# Patient Record
Sex: Male | Born: 1935 | Race: White | Hispanic: No | Marital: Married
Health system: Southern US, Community
[De-identification: ages and names within clinical notes are randomized; demographics above are authoritative.]

## PROBLEM LIST (undated history)

## (undated) ENCOUNTER — Encounter: Attending: Hematology & Oncology | Primary: Hematology & Oncology

## (undated) ENCOUNTER — Telehealth: Attending: Hematology & Oncology | Primary: Hematology & Oncology

## (undated) ENCOUNTER — Ambulatory Visit

## (undated) ENCOUNTER — Encounter

## (undated) ENCOUNTER — Telehealth: Attending: Internal Medicine | Primary: Internal Medicine

## (undated) ENCOUNTER — Telehealth
Attending: Pharmacist Clinician (PhC)/ Clinical Pharmacy Specialist | Primary: Pharmacist Clinician (PhC)/ Clinical Pharmacy Specialist

## (undated) ENCOUNTER — Non-Acute Institutional Stay: Payer: MEDICARE | Attending: Family | Primary: Family

## (undated) ENCOUNTER — Encounter: Payer: MEDICARE | Attending: Gerontology | Primary: Gerontology

## (undated) ENCOUNTER — Encounter: Attending: Family | Primary: Family

## (undated) ENCOUNTER — Telehealth

## (undated) ENCOUNTER — Telehealth: Attending: Children | Primary: Children

## (undated) ENCOUNTER — Encounter: Payer: MEDICARE | Attending: Family | Primary: Family

## (undated) ENCOUNTER — Encounter: Attending: Internal Medicine | Primary: Internal Medicine

## (undated) ENCOUNTER — Ambulatory Visit: Payer: MEDICARE | Attending: Internal Medicine | Primary: Internal Medicine

## (undated) ENCOUNTER — Telehealth: Attending: Family | Primary: Family

## (undated) ENCOUNTER — Ambulatory Visit: Payer: MEDICARE | Attending: Family | Primary: Family

## (undated) ENCOUNTER — Encounter: Attending: Children | Primary: Children

## (undated) ENCOUNTER — Ambulatory Visit: Payer: MEDICARE

## (undated) ENCOUNTER — Encounter: Payer: MEDICARE | Attending: Foot & Ankle Surgery | Primary: Foot & Ankle Surgery

## (undated) ENCOUNTER — Ambulatory Visit: Payer: MEDICARE | Attending: Hematology & Oncology | Primary: Hematology & Oncology

## (undated) ENCOUNTER — Ambulatory Visit
Attending: Pharmacist Clinician (PhC)/ Clinical Pharmacy Specialist | Primary: Pharmacist Clinician (PhC)/ Clinical Pharmacy Specialist

## (undated) ENCOUNTER — Encounter: Payer: MEDICARE | Attending: Internal Medicine | Primary: Internal Medicine

## (undated) MED ORDER — CHLORTHALIDONE 25 MG TABLET: tablet | 0 refills | 0 days

## (undated) MED ORDER — POLYETHYLENE GLYCOL 3350 17 GRAM/DOSE ORAL POWDER: Freq: Every day | ORAL | 0 days

## (undated) MED ORDER — VITAMIN B COMPLEX CAPSULE: Freq: Every day | ORAL | 0 days

## (undated) MED ORDER — CHLORTHALIDONE 25 MG TABLET: Freq: Every morning | ORAL | 0 days | Status: SS

---
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
## 2004-11-23 ENCOUNTER — Ambulatory Visit: Payer: Self-pay | Admitting: Gastroenterology

## 2009-06-07 ENCOUNTER — Ambulatory Visit: Payer: Self-pay | Admitting: Internal Medicine

## 2010-08-25 ENCOUNTER — Ambulatory Visit: Payer: Self-pay | Admitting: Urology

## 2011-01-01 ENCOUNTER — Ambulatory Visit: Payer: Self-pay | Admitting: Urology

## 2011-01-17 ENCOUNTER — Inpatient Hospital Stay: Payer: Self-pay | Admitting: Internal Medicine

## 2011-01-17 DIAGNOSIS — I459 Conduction disorder, unspecified: Secondary | ICD-10-CM

## 2011-01-17 LAB — BASIC METABOLIC PANEL
Anion Gap: 12 (ref 7–16)
BUN: 15 mg/dL (ref 7–18)
Calcium, Total: 8.7 mg/dL (ref 8.5–10.1)
Chloride: 107 mmol/L (ref 98–107)
Co2: 26 mmol/L (ref 21–32)
Creatinine: 0.71 mg/dL (ref 0.60–1.30)
EGFR (African American): >60
EGFR (Non-African Amer.): >60
Glucose: 93 mg/dL (ref 65–99)
Osmolality: 289 (ref 275–301)
Potassium: 4 mmol/L (ref 3.5–5.1)
Sodium: 145 mmol/L (ref 136–145)

## 2011-01-17 LAB — CBC WITH DIFFERENTIAL/PLATELET
Basophil #: 0 10*3/uL (ref 0.0–0.1)
Basophil %: 0.4 %
Eosinophil #: 0.1 10*3/uL (ref 0.0–0.7)
Eosinophil %: 1.3 %
HCT: 37.7 % — ABNORMAL LOW (ref 40.0–52.0)
HGB: 12.5 g/dL — ABNORMAL LOW (ref 13.0–18.0)
Lymphocyte #: 0.9 10*3/uL — ABNORMAL LOW (ref 1.0–3.6)
Lymphocyte %: 18.3 %
MCH: 31 pg (ref 26.0–34.0)
MCHC: 33.2 g/dL (ref 32.0–36.0)
MCV: 94 fL (ref 80–100)
Monocyte #: 0.4 10*3/uL (ref 0.0–0.7)
Monocyte %: 7.7 %
Neutrophil #: 3.4 10*3/uL (ref 1.4–6.5)
Neutrophil %: 72.3 %
Platelet: 275 10*3/uL (ref 150–440)
RBC: 4.03 10*6/uL — ABNORMAL LOW (ref 4.40–5.90)
RDW: 13.1 % (ref 11.5–14.5)
WBC: 4.7 10*3/uL (ref 3.8–10.6)

## 2011-01-17 LAB — CK TOTAL AND CKMB (NOT AT ARMC)
CK, Total: 72 U/L (ref 35–232)
CK, Total: 78 U/L (ref 35–232)
CK-MB: 1.9 ng/mL (ref 0.5–3.6)
CK-MB: 2 ng/mL (ref 0.5–3.6)

## 2011-01-17 LAB — TROPONIN I
Troponin-I: <0.02 ng/mL
Troponin-I: 0.08 ng/mL — ABNORMAL HIGH

## 2011-01-17 LAB — PLATELET COUNT: Platelet: 291 10*3/uL (ref 150–440)

## 2011-01-17 LAB — PROTIME-INR: Prothrombin Time: 13.2 secs (ref 11.5–14.7)

## 2011-01-18 LAB — CBC WITH DIFFERENTIAL/PLATELET
Basophil %: 0.6 %
Eosinophil %: 1.8 %
HCT: 36.2 % — ABNORMAL LOW (ref 40.0–52.0)
HGB: 12 g/dL — ABNORMAL LOW (ref 13.0–18.0)
Lymphocyte #: 0.8 10*3/uL — ABNORMAL LOW (ref 1.0–3.6)
MCH: 31.2 pg (ref 26.0–34.0)
MCV: 94 fL (ref 80–100)
Monocyte #: 0.3 10*3/uL (ref 0.0–0.7)
Monocyte %: 7.4 %
Neutrophil #: 3.4 10*3/uL (ref 1.4–6.5)
RBC: 3.86 10*6/uL — ABNORMAL LOW (ref 4.40–5.90)
WBC: 4.6 10*3/uL (ref 3.8–10.6)

## 2011-01-18 LAB — BASIC METABOLIC PANEL
Anion Gap: 10 (ref 7–16)
Calcium, Total: 8.4 mg/dL — ABNORMAL LOW (ref 8.5–10.1)
Co2: 28 mmol/L (ref 21–32)
Creatinine: 0.8 mg/dL (ref 0.60–1.30)
EGFR (African American): >60
EGFR (Non-African Amer.): >60
Glucose: 98 mg/dL (ref 65–99)
Osmolality: 289 (ref 275–301)
Potassium: 3.8 mmol/L (ref 3.5–5.1)

## 2011-01-18 LAB — URINALYSIS, COMPLETE
Bilirubin,UR: NEGATIVE
Blood: NEGATIVE
Glucose,UR: NEGATIVE mg/dL (ref 0–75)
Ketone: NEGATIVE
Leukocyte Esterase: NEGATIVE
Nitrite: NEGATIVE
Ph: 7 (ref 4.5–8.0)
Protein: NEGATIVE
Specific Gravity: 1.011 (ref 1.003–1.030)
Squamous Epithelial: <1

## 2011-01-18 LAB — LIPID PANEL
HDL Cholesterol: 22 mg/dL — ABNORMAL LOW (ref 40–60)
Triglycerides: 90 mg/dL (ref 0–200)

## 2011-01-18 LAB — CK TOTAL AND CKMB (NOT AT ARMC)
CK, Total: 91 U/L (ref 35–232)
CK-MB: 3.9 ng/mL — ABNORMAL HIGH (ref 0.5–3.6)

## 2011-01-18 LAB — TROPONIN I: Troponin-I: 0.6 ng/mL — ABNORMAL HIGH

## 2011-01-22 ENCOUNTER — Emergency Department: Payer: Self-pay | Admitting: Emergency Medicine

## 2011-01-22 LAB — PROTIME-INR: Prothrombin Time: 14.2 secs (ref 11.5–14.7)

## 2011-05-04 ENCOUNTER — Ambulatory Visit: Payer: Self-pay | Admitting: Family Medicine

## 2011-05-21 ENCOUNTER — Ambulatory Visit: Payer: Self-pay | Admitting: Urology

## 2011-05-21 LAB — PROTIME-INR
INR: 1
Prothrombin Time: 13.1 secs (ref 11.5–14.7)

## 2011-05-21 LAB — PLATELET COUNT: Platelet: 148 10*3/uL — ABNORMAL LOW (ref 150–440)

## 2011-05-21 LAB — APTT: Activated PTT: 29.3 secs (ref 23.6–35.9)

## 2012-07-10 ENCOUNTER — Ambulatory Visit: Payer: Self-pay | Admitting: Family Medicine

## 2012-07-28 ENCOUNTER — Ambulatory Visit: Payer: Self-pay

## 2012-07-31 ENCOUNTER — Ambulatory Visit: Payer: Self-pay

## 2013-09-29 ENCOUNTER — Ambulatory Visit: Payer: Self-pay | Admitting: Rheumatology

## 2014-05-09 NOTE — H&P (Signed)
PATIENT NAME:  Jerome Oconnor, Jerome Oconnor MR#:  [id number] DATE OF BIRTH:  06-27-1935  DATE OF ADMISSION:  01/17/2011  REFERRING PHYSICIAN: Dr. Achilles Dunk  PRIMARY CARE PHYSICIAN: Dr. Quillian Quince   REASON FOR ADMISSION: Bradycardia with complete heart block.  HISTORY OF PRESENT ILLNESS: Patient is a pleasant 79 year old male with past medical history of hypertension, hyperlipidemia and prostate cancer who was in Special Procedure depression earlier today to undergo an outpatient biopsy of a pelvic mass by Dr. Achilles Dunk and was noted to be bradycardic and in complete heart block therefore procedure was canceled and postponed at this time and hospitalist services were contacted for further evaluation and for hospital admission. On cardiac monitor patient noted to be bradycardic currently heart rate 37 beats per minute. 12-lead EKG was obtained revealing complete heart block with heart rate 36 beats per minute. He has already been seen by cardiology in the special procedures unit and at this time the tentative plan is for patient to undergo permanent pacemaker insertion later today by Dr. Juel Burrow. Patient currently reports left thigh and calf pain and swelling over the past couple of days but is otherwise without specific complaints. He denies any dizziness, lightheadedness, weakness or any chest pain or heart palpitations or any syncopal episodes. Patient states he has had chronic bradycardia and states his heart rate has never been above 42 beats per minute. He has never had any prior cardiac history or had any further cardiac work-up in the past. Otherwise, he is without specific complaints at this time. Hospitalist services were contacted for hospital admission.   PAST SURGICAL HISTORY:  1. Robotic surgery for prostate cancer 2007.  2. Colonoscopy.  PAST MEDICAL HISTORY: 1. Cervical prostate cancer status post robotic surgery.  2. Hypertension. 3. Hypercholesterolemia.  4. History of chronic bradycardia.   ALLERGIES: No  known drug allergies.   HOME MEDICATIONS:  1. Lovastatin 40 mg daily.  2. Chlorthalidone 12.5 mg daily.  3. Aspirin 325 mg 0.5 daily.   FAMILY HISTORY: Mother deceased, history of diabetes mellitus. Father deceased, history of emphysema, unspecified heart disease.   SOCIAL HISTORY: Negative for tobacco. Alcohol-Occasional red wine. Illicit drugs-None. Patient lives at home with his [place] He has one child.    REVIEW OF SYSTEMS: CONSTITUTIONAL: Denies fevers, chills, recent changes in weight or weakness. HEAD/EYES: Denies headache or blurry vision. ENT: Denies tinnitus, earache, nasal discharge, or sore throat. RESPIRATORY: Denies shortness breath, cough, or wheezing. CARDIOVASCULAR: Denies chest pain, heart palpitations, lower extremity edema. GASTROINTESTINAL: Denies nausea, vomiting, diarrhea, constipation, melena, hematochezia, abdominal pains. GENITOURINARY: Denies dysuria, hematuria. ENDOCRINE: Denies heat or cold intolerance. HEME/LYMPH: Denies easy bruising or bleeding. INTEGUMENTARY: Denies rash. MUSCULOSKELETAL: Has left thigh and calf pain and swelling, otherwise, denies muscle weakness. NEUROLOGIC: Denies headache, numbness, weakness, tingling or dysarthria. PSYCHIATRIC: Denies depression or anxiety.   PHYSICAL EXAMINATION:  VITAL SIGNS: Temperature 97.2, pulse 37, blood pressure 140/70, respirations 20, oxygen saturation 97% on room air.   GENERAL: Pleasant male, alert, oriented not acutely distressed.   HEENT: Normocephalic atraumatic. Pupils are equal, round, and reactive to light and accommodation. Extraocular movements are intact. Anicteric sclerae. Conjunctivae pink. Hearing intact to voice. Nares without drainage. Oral mucosa moist without lesions.   NECK: Supple with full range of motion. No JVD lymphadenopathy, or carotid bruits bilaterally. No thyromegaly or tenderness to palpation over thyroid gland.   CHEST: Normal respiratory effort without use  of accessory respiratory muscles. Lungs are clear to auscultation bilaterally without crackles, rales, or  wheezes.   CARDIOVASCULAR: S1, S2 positive bradycardic. No murmurs, rubs, or gallops. PMI is non- lateralized.   ABDOMEN: Soft, nontender, nondistended. Normoactive bowel sounds. No hepatosplenomegaly or palpable masses. No hernias.   EXTREMITIES: No clubbing, cyanosis, or edema. Pedal pulses are palpable bilaterally. He has got mild tenderness to palpation on the left thigh and calf region. Homans sign is negative. No overlying erythema, warmth or lesions noted. Pedal pulses are palpable bilaterally. Patient has full range of motion in all directions of proximal and distal lower extremity on the left. Right lower extremity with unremarkable.   LYMPH: No cervical lymphadenopathy.   SKIN: No suspicious rashes. Skin turgor is good. No ulcers.   NEUROLOGICAL: Alert and oriented x3. Cranial nerves II through XII are grossly intact. No focal deficits.   PSYCH: Pleasant male with appropriate affect.   LABORATORY, DIAGNOSTIC AND RADIOLOGICAL DATA:  EKG revealing heart rate 36 beats per minute with complete heart block.   BMP within normal limits.   CBC: WBC 4.7, hemoglobin 12.5, hematocrit 37.7, platelets 275.  INR 1.   ASSESSMENT AND PLAN: 79 year old male with past medical history of hypertension, hyperlipidemia, prostate cancer, chronic bradycardia, pelvic mass here with:  1. Bradycardia with complete heart block. Will admit patient to telemetry unit. Patient has already been by cardiology in the special procedure unit and plan is for patient to undergo permanent pacemaker insertion today by Dr. Juel Burrow. For now will avoid negative chronotropic agents including beta blockers and calcium channel blockers. Also check magnesium and TSH level. Patient has no prior cardiac history and exact cause of complete heart block and bradycardia is unknown but he states he has had chronic bradycardia which  has been asymptomatic. Will cycle patient's cardiac enzymes. He may need further ischemic evaluation and work-up as well and cardiology is already following. Await further recommendations from cardiology. Patient will go for permanent pacemaker insertion shortly and thereafter will be admitted to the telemetry unit for close monitoring. Further work-up and management to follow depending on patient's clinical course.  2. Hypertension. Blood pressure currently 140/70. He is currently n.p.o. Will hold chlorthalidone to prevent dehydration. Hold blood pressure medication for now since patient is bradycardic in complete heart block and he is currently perfusing well. Will monitor blood pressure closely post permanent pacemaker insertion and if his blood pressure remains uncontrolled after pacemaker insertion will consider agent such as p.r.n. IV hydralazine which would not lower the heart rate.  3. Hyperlipidemia. Continue statin after pacemaker insertion and check fasting lipid profile in a.m.  4. Left thigh/leg swelling and pain. Patient states that he has been ambulatory rather than sedentary. Will obtain lower extremity venous Doppler ultrasound to assess for possible underlying deep venous thrombosis and p.r.n. pain control in the meanwhile.  5. History of prostate cancer now with pelvic mass. Patient can continue to follow up with Dr. Achilles Dunk after complete heart block has been stabilized and his biopsy has been postponed for now.  6. Deep venous thrombosis prophylaxis. Will avoid antiplatelets and anticoagulants for now in anticipation of permanent pacemaker insertion.  Would like to place SCDs and TEDs however patient may have an underlying DVT involving left lower extremitiy given his pain and swelling and would not want to mobilize/embolize a potential clot/thrombus.  Will await left lower extremity venous doppler ultrasound and place SCD and TED to right lower extremity. 7. CODE STATUS: FULL CODE.   TIME  SPENT ON THIS ADMISSION: Approximately 45 minutes.   ____________________________ Elon Alas,  MD knl:cms D: 01/17/2011 12:39:43 ET T: 01/17/2011 13:06:06 ET JOB#: [id number]  cc: Elon AlasKamran N. Wendee Hata, MD, <Dictator> Madolyn FriezeBrian S. Achilles Dunkope, MD L. Clayborn BignessKatherine Bliss, MD Corky DownsJaved Masoud, MD Jonetta OsgoodKAMRAN Lizabeth LeydenN Amandeep Hogston MD ELECTRONICALLY SIGNED 02/01/2011 18:51

## 2014-05-09 NOTE — Consult Note (Signed)
Brief Consult Note: Diagnosis: CHB/Brady/SSS.   Patient was seen by consultant.   Consult note dictated.   Recommend further assessment or treatment.   Orders entered.   Discussed with Attending MD.   Comments: IMP CHB SSS Bradycardia HTN Hyperlipidemia Prostate Ca Pre-op Bx . PLAN Cancel Bx Consult Masoud for PPM Continue to hold ASA Tele NPO for PPM Statin for lipids ECHO for CHB.  Electronic Signatures: Dorothyann Pengallwood, Dwayne D (MD)  (Signed 02-Jan-13 10:09)  Authored: Brief Consult Note   Last Updated: 02-Jan-13 10:09 by Dorothyann Pengallwood, Dwayne D (MD)

## 2014-05-09 NOTE — Op Note (Signed)
PATIENT NAME:  Jerome Oconnor, Jerome Oconnor MR#:  [id number] DATE OF BIRTH:  13-Feb-1935  DATE OF PROCEDURE:  01/17/2011  CLINICAL HISTORY: The patient was seen in the Special Procedures area when he was found to be in complete heart block with a heart rate of 29. The patient was supposed to have a biopsy of the prostate done which was canceled and instead he was transferred to the operating room for insertion of a permanent pacemaker. The patient denies any history of chest pain, nausea, vomiting, or any flu-like illness. He has a history of dizziness, never passed out.   SURGEON: Corky DownsJaved Langston Tuberville, MD   PROCEDURE NOTE: The patient was taken to the operating room. The left upper chest was prepared with Hibiclens and it was draped in a sterile fashion. Next, lidocaine was infiltrated under the skin and subcutaneous tissue. An incision was made on the deltopectoral groove. Cephalic vein could not be isolated so patient was put in Trendelenburg position and left subclavian stick was made, one for the atrial lead and one for the ventricular lead. Ventricular lead was introduced from the ventricular port and optimum position was obtained at the floor of the right ventricle. Stimulation thresholds were found to be satisfactory. Left atrial lead was introduced through a separate stick and separate port and optimum position was obtained in the right atrial appendage. Both leads were checked for position and stability several times before tying them up on the top of the suture sleeve to the pectoralis major muscle. Right ventricular lead model #5092 58 cm and right atrial lead is Medtronic 5592 53 cm. Pacemaker is Adapta ADDR01, serial number [id number]. After making sure the leads are in good position and confirming the position on fluoroscopy, both leads were tied to the pacemaker making sure that all connections were secured according to company specification. Next, a pocket was created under the superficial fascia. All bleeding  points were cauterized. Pacemaker was put in the pocket and subcutaneous tissue and skin was sutured separately. The patient tolerated the procedure well and returned to the recovery room in a stable condition. He will be admitted for 24 hours. Hopefully he can have his prostate biopsy done in the next two weeks after removal of the stitches.   ____________________________ Corky DownsJaved Jacklynn Dehaas, MD jm:drc D: 01/17/2011 15:30:14 ET T: 01/17/2011 16:19:38 ET JOB#: [id number]  cc: Corky DownsJaved Sam Overbeck, MD, <Dictator> Corky DownsJAVED Lelah Rennaker MD ELECTRONICALLY SIGNED 01/30/2011 13:53

## 2014-05-09 NOTE — H&P (Signed)
PATIENT NAME:  Jerome Oconnor, Jerome Oconnor MR#:  [id number] DATE OF BIRTH:  15-Oct-1935  DATE OF ADMISSION:  01/17/2011  HISTORY OF PRESENT ILLNESS: Jerome Oconnor Jerome Oconnor was admitted into the hospital with a complete heart block. Earlier this patient was seen in the special unit when he was scheduled to have a biopsy for prostate cancer. On examination of the EKG, it was detected that he was in complete heart block. The patient denies any history of chest pain. He had robotic surgery for prostate cancer in 2007. There is no history of stroke or arrhythmia. He denies any history of syncope but complained of dizzy spell when he walked upstairs. His EKG shows complete heart block with a slow heart rate of 29 and right bundle branch block pattern. He does not have any history of diabetes, asthma, or high blood pressure.   PERSONAL HISTORY: Does not smoke. Denies any history of drinking alcohol.   PAST SURGICAL HISTORY:  1. Robotic prostate cancer surgery in 2007. 2. History of colonoscopy in the past.   SYSTEMIC REVIEW: He has left leg pain and some arthritis of the knee. Denies any history of eye, ear, nose or throat trouble. There is no history of trouble swallowing, pneumonia, TB, ulcer disease, hepatitis, jaundice, change in bowel habits. Denies any history of headache, visual impairment, hearing loss, vertigo, hoarseness of the voice. There is no swelling of the glands in the neck. Denies any history of cough and wheezing. There is no history of abdominal pain or swelling of the abdomen. There is no depression or any mood changes.   PHYSICAL EXAMINATION:   GENERAL: The patient is a well nourished male in no acute distress.   VITAL SIGNS: Blood pressure is found to be 120/80.   HEENT: Head normocephalic. Pupils reactive. Sclerae nonicteric. Tongue is moist, papillated.   NECK: Supple. Jugular venous pressure is not elevated. Carotid upstroke is 2+ without any bruit. There is no goiter. No lymphadenopathy. Trachea is  central.   CARDIOVASCULAR: Apical impulse is palpable in the fifth intercostal space. First heart sound is distant. Second heart sound is also distant. Heart sounds are varying in intensity.   CHEST: Decreased breath sounds without any rales or rhonchi.   ABDOMEN: Soft, nontender. Liver and spleen are not enlarged. No pulsatile masses palpable. There is no pedal edema. No calf tenderness.   DIAGNOSTIC DATA: Electrocardiogram revealed complete heart block.   PLAN OF TREATMENT: The patient will be taken to the operating room after 1 and will schedule him for insertion of a permanent pacemaker. Informed consent has been obtained. Risk of pneumothorax and bleeding also has been explained to the patient. He seems to be agreeable for that. His wife is not here but the patient has just talked to his wife and she is also agreeable for insertion of pacemaker. I have also talked with Dr. Achilles Oconnor and Dr. Juliann Oconnor and explained the nature of the procedure to them.   ____________________________ Jerome DownsJaved Merrie Epler, MD jm:drc D: 01/17/2011 11:19:24 ET T: 01/17/2011 11:35:42 ET JOB#: [id number]  cc: Jerome DownsJaved Jerome Lubeck, MD, <Dictator> Jerome DownsJAVED Jerome Duvall MD ELECTRONICALLY SIGNED 01/30/2011 13:53
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
## 2014-05-09 NOTE — Discharge Summary (Signed)
PATIENT NAME:  Jerome Oconnor, Jerome Oconnor MR#:  [id number] DATE OF BIRTH:  September 06, 1935  DATE OF ADMISSION:  01/17/2011 DATE OF DISCHARGE:  01/19/2011  DIAGNOSES:  1. Complete heart block status post pacemaker placement.  2. Left lower extremity deep vein thrombosis. 3. Hypertension. 4. Hyperlipidemia,  5. History of prostate cancer now with pelvic mass.   DISPOSITION: Patient is being discharged home.   FOLLOW UP: Follow up with Dr. Quillian QuinceBliss on Tuesday, 01/23/2011 for PT-INR check-up. Follow up with Dr. Juliann Paresallwood in 1 to 2 weeks after discharge.   DIET: Low sodium.   ACTIVITY: As tolerated.   DISCHARGE MEDICATIONS:  1. Lovenox 100 mg subcutaneously b.i.d. for five days. 2. Coumadin 5 mg daily.  3. Lovastatin 40 mg daily.   LABORATORY, DIAGNOSTIC, AND RADIOLOGICAL DATA: Lower extremity Doppler showed left lower extremity deep vein thrombosis. Chest x-ray showed no acute cardiopulmonary pathology. CBC normal other than hemoglobin of 12.5. Complete metabolic panel normal. LDL 73. Cardiac enzymes mildly elevated. TSH 6.48.   HOSPITAL COURSE: Patient is a 79 year old male with past medical history of prostate cancer, hypertension, hyperlipidemia who was undergoing biopsy for a pelvic mass when he was found to be in complete heart block. Dr. Juel BurrowMasoud was emergently contacted and patient underwent emergent pacemaker placement. During the rest of the hospitalization patient's heart rate has been controlled and paced. Patient complained of pain and swelling in his legs and a Doppler was done which showed the left lower extremity deep vein thrombosis. Patient was started on therapeutic dose Lovenox and has been started on Coumadin. He has been taught to self administer Lovenox. Patient's primary care physician, Dr. Aggie CosierBliss's office, was contacted and outpatient follow up has been arranged on 01/23/2011 so that patient can have a PT-INR check-up. Patient is being discharged home in a stable condition. All of his questions  were answered. He will follow up with his primary care physician and Dr. Juliann Paresallwood as an outpatient.   TIME SPENT: 45 minutes.  ____________________________ Darrick MeigsSangeeta Tonita Bills, MD sp:cms D: 01/19/2011 16:48:03 ET T: 01/22/2011 10:39:26 ET JOB#: [id number]  cc: Darrick MeigsSangeeta Kaylib Furness, MD, <Dictator> Darrick MeigsSANGEETA Emmalynne Courtney MD ELECTRONICALLY SIGNED 01/22/2011 12:17

## 2015-07-03 IMAGING — CT CT LUMBAR SPINE WITHOUT CONTRAST
3 of 9 series · 14 of 33 positions shown, 17 images · non-contrast
Comparison: July 28, 2012.

CLINICAL DATA: Right leg numbness and weakness.

EXAM:
CT LUMBAR SPINE WITHOUT CONTRAST
TECHNIQUE: Multidetector CT imaging of the lumbar spine was performed without
intravenous contrast administration. Multiplanar CT image
reconstructions were also generated.

[Series 4: l spine soft · axial · 0.35mm/px · z∈[-873,-701]mm · 6 of 120 slices shown, 8 images]
[im 18/120  soft-tissue]
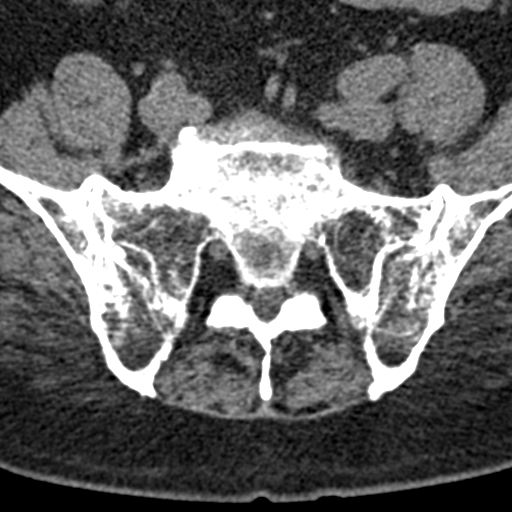
[im 18/120  bone]
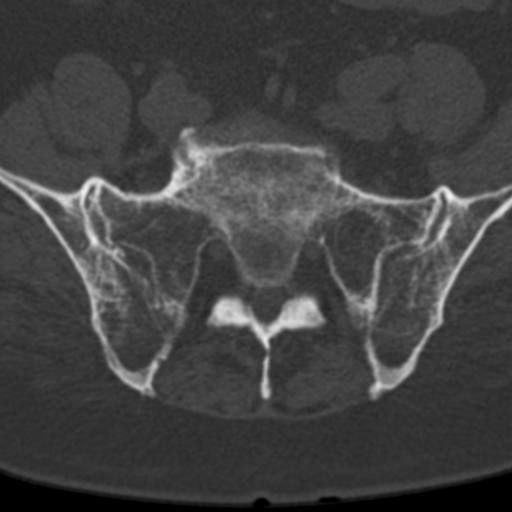
[im 35/120  bone]
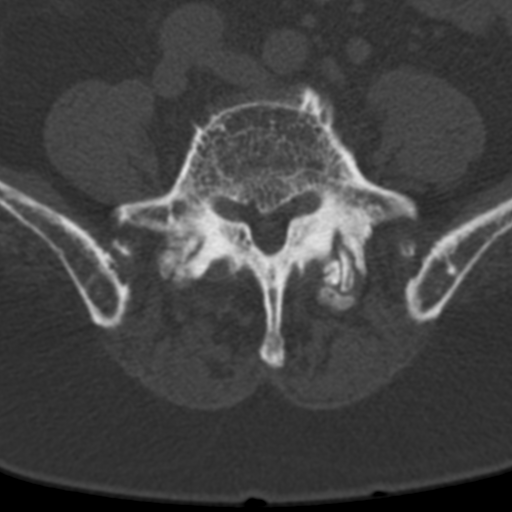
[im 52/120  bone]
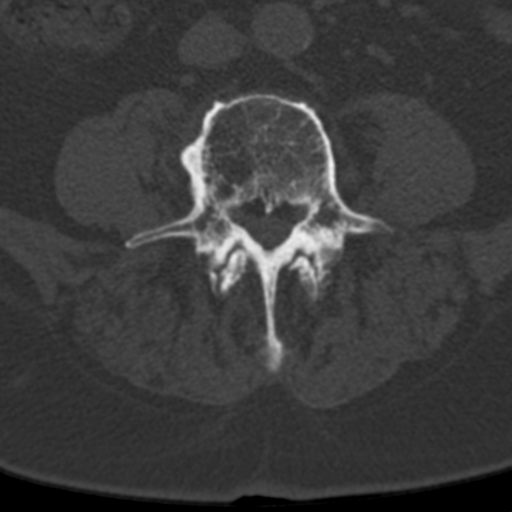
[im 69/120  bone]
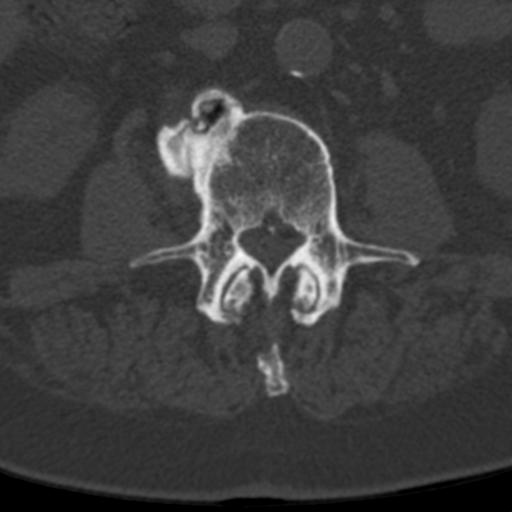
[im 86/120  soft-tissue]
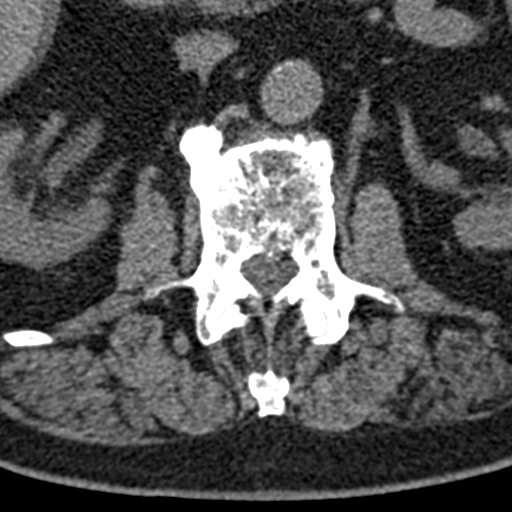
[im 86/120  bone]
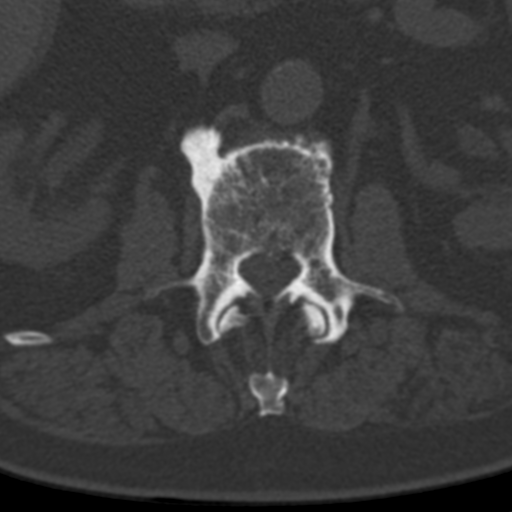
[im 103/120  bone]
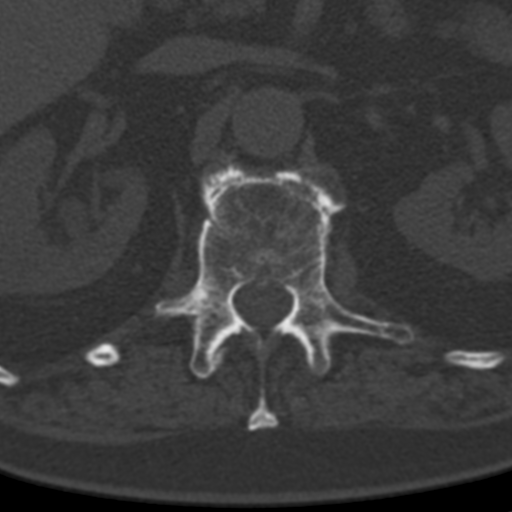

[Series 602: coronal bone · coronal · 0.47mm/px · 3 of 70 slices shown]
[im 14/70  bone]
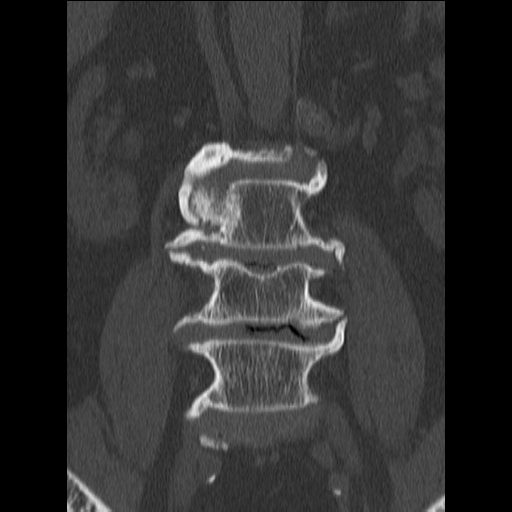
[im 28/70  bone]
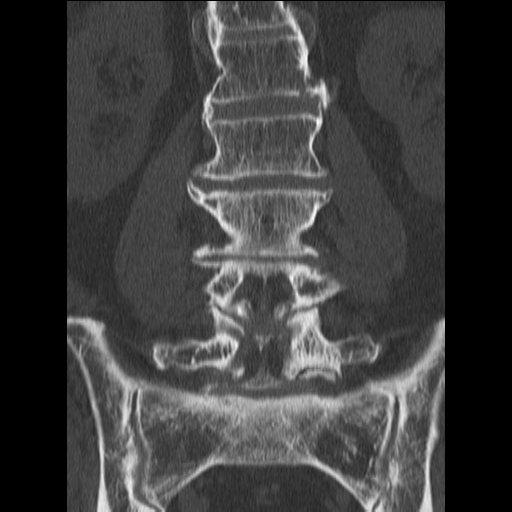
[im 42/70  bone]
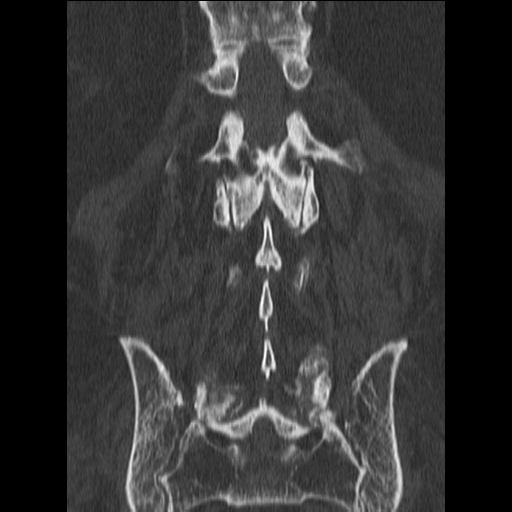

[Series 603: sagittal bone · sagittal · 0.48mm/px · 5 of 75 slices shown, 6 images]
[im 25/75  bone]
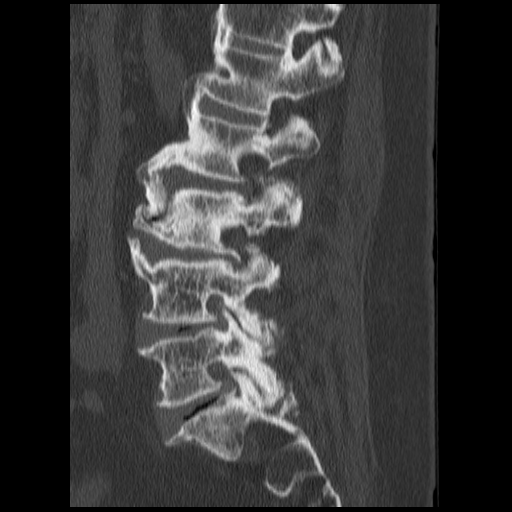
[im 31/75  bone]
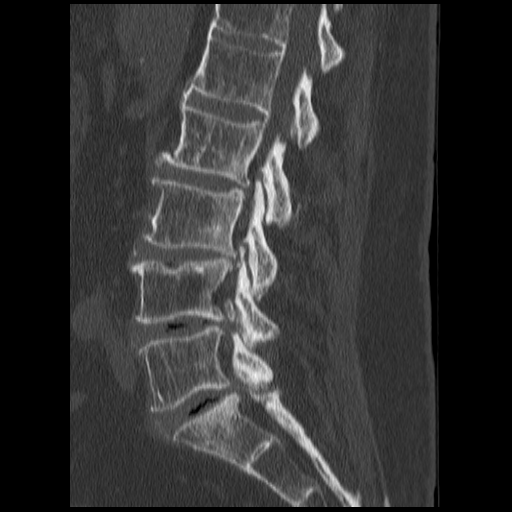
[im 38/75  soft-tissue]
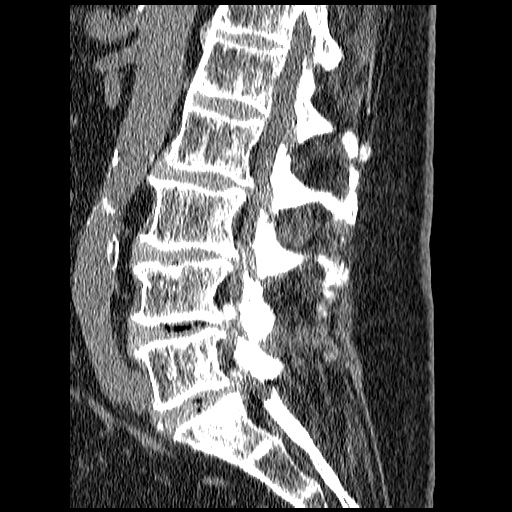
[im 38/75  bone]
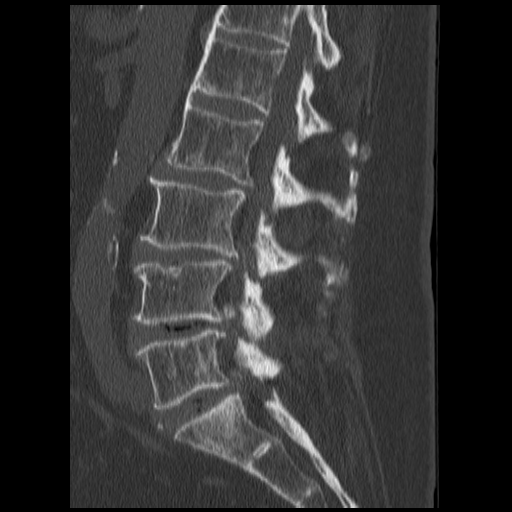
[im 44/75  bone]
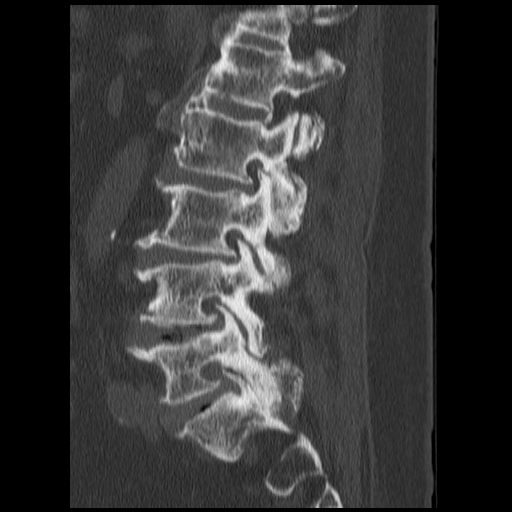
[im 50/75  bone]
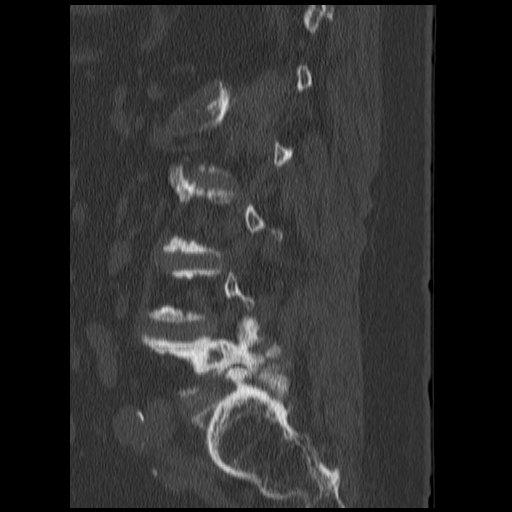

[14 of 33 positions shown; findings below may reference images not displayed]

FINDINGS: No fracture or spondylolisthesis is noted. Moderate degenerative
disc disease is noted at L2-3 and L3-4 with severe anterior
osteophyte formation. Mild degenerative disc disease is noted at
L4-5 and L5-S1 with vacuum phenomenon and mild anterior osteophyte
formation.

Mild central spinal canal stenosis is noted at L2-3 secondary to
hypertrophy of posterior facet joints and combination of broad-based
posterior disc bulging and osteophyte formation. Moderate bilateral
neural foraminal stenosis is also noted secondary to uncovertebral
spurring and posterior facet joint hypertrophy.

Mild to moderate central spinal canal stenosis is noted at L3-4
secondary to posterior facet joint hypertrophy as well as a
combination of broad-based posterior disc bulging and posterior
osteophyte formation. Severe bilateral neural foraminal stenosis is
noted secondary to hypertrophy of posterior facet joints.

Severe central spinal canal stenosis is noted at L4-5 secondary to
posterior osteophyte formation and combination of broad-based
posterior disc bulging and hypertrophy of posterior facet joints.
Severe bilateral neural foraminal stenosis is noted secondary to
hypertrophy of posterior facet joints in bilateral uncovertebral
spurring.

Moderate central spinal canal stenosis is noted at L5-S1 secondary
to hypertrophy of posterior facet joints and broad-based posterior
disc bulging. Mild bilateral neural foraminal stenosis is noted
secondary to hypertrophy of posterior facet joints.
IMPRESSION: Multilevel degenerative disc disease is noted with varying degrees
of central spinal canal and bilateral neural foraminal stenosis as
described above. No fracture or significant spondylolisthesis is
noted.

## 2016-07-16 ENCOUNTER — Ambulatory Visit
Admission: RE | Admit: 2016-07-16 | Discharge: 2016-07-16 | Disposition: A | Discharge status: Home / Self Care | Payer: MEDICARE

## 2016-07-16 ENCOUNTER — Ambulatory Visit
Admission: RE | Admit: 2016-07-16 | Discharge: 2016-07-16 | Disposition: A | Discharge status: Home / Self Care | Payer: MEDICARE | Attending: Hematology & Oncology | Admitting: Hematology & Oncology

## 2016-07-16 DIAGNOSIS — C7951 Secondary malignant neoplasm of bone: Secondary | ICD-10-CM

## 2016-07-16 DIAGNOSIS — C61 Malignant neoplasm of prostate: Principal | ICD-10-CM

## 2016-07-16 MED ORDER — LEVOTHYROXINE 88 MCG TABLET
ORAL_TABLET | 1 refills | 0 days | Status: CP
Start: 2016-07-16 — End: 2016-07-30

## 2016-07-16 MED ORDER — CHLORTHALIDONE 25 MG TABLET
ORAL_TABLET | 1 refills | 0 days | Status: CP
Start: 2016-07-16 — End: 2016-07-30

## 2016-07-19 MED ORDER — CHLORTHALIDONE 25 MG TABLET
ORAL_TABLET | Freq: Every day | ORAL | 1 refills | 0 days | Status: CP
Start: 2016-07-19 — End: 2017-01-01

## 2016-07-19 MED ORDER — LEVOTHYROXINE 88 MCG TABLET
ORAL_TABLET | Freq: Every day | ORAL | 1 refills | 0 days | Status: CP
Start: 2016-07-19 — End: 2017-01-11

## 2016-07-30 ENCOUNTER — Ambulatory Visit: Admission: RE | Admit: 2016-07-30 | Discharge: 2016-07-30 | Payer: MEDICARE | Attending: Family | Admitting: Family

## 2016-07-30 DIAGNOSIS — I10 Essential (primary) hypertension: Principal | ICD-10-CM

## 2016-07-30 DIAGNOSIS — E781 Pure hyperglyceridemia: Secondary | ICD-10-CM

## 2016-07-30 DIAGNOSIS — E034 Atrophy of thyroid (acquired): Secondary | ICD-10-CM

## 2016-08-28 ENCOUNTER — Ambulatory Visit
Admission: RE | Admit: 2016-08-28 | Discharge: 2016-08-28 | Disposition: A | Discharge status: Home / Self Care | Payer: MEDICARE | Attending: Hematology & Oncology | Admitting: Hematology & Oncology

## 2016-08-28 ENCOUNTER — Ambulatory Visit: Admission: RE | Admit: 2016-08-28 | Discharge: 2016-08-28 | Disposition: A | Discharge status: Home / Self Care

## 2016-08-28 DIAGNOSIS — C61 Malignant neoplasm of prostate: Principal | ICD-10-CM

## 2016-08-28 DIAGNOSIS — C7951 Secondary malignant neoplasm of bone: Secondary | ICD-10-CM

## 2016-09-24 MED ORDER — LOVASTATIN 40 MG TABLET
ORAL_TABLET | 1 refills | 0 days | Status: CP
Start: 2016-09-24 — End: 2017-03-08

## 2016-10-09 ENCOUNTER — Ambulatory Visit
Admission: RE | Admit: 2016-10-09 | Discharge: 2016-10-09 | Disposition: A | Discharge status: Home / Self Care | Payer: MEDICARE

## 2016-10-09 ENCOUNTER — Ambulatory Visit
Admission: RE | Admit: 2016-10-09 | Discharge: 2016-10-09 | Disposition: A | Discharge status: Home / Self Care | Payer: MEDICARE | Attending: Hematology & Oncology | Admitting: Hematology & Oncology

## 2016-10-09 DIAGNOSIS — C61 Malignant neoplasm of prostate: Principal | ICD-10-CM

## 2016-10-09 DIAGNOSIS — C7951 Secondary malignant neoplasm of bone: Secondary | ICD-10-CM

## 2016-10-09 DIAGNOSIS — C775 Secondary and unspecified malignant neoplasm of intrapelvic lymph nodes: Secondary | ICD-10-CM

## 2016-10-22 MED ORDER — GABAPENTIN 300 MG CAPSULE
ORAL_CAPSULE | 1 refills | 0 days | Status: CP
Start: 2016-10-22 — End: 2017-05-21

## 2016-11-06 ENCOUNTER — Ambulatory Visit
Admission: RE | Admit: 2016-11-06 | Discharge: 2016-11-06 | Disposition: A | Discharge status: Home / Self Care | Payer: MEDICARE | Attending: Family | Admitting: Family

## 2016-11-06 DIAGNOSIS — I10 Essential (primary) hypertension: Principal | ICD-10-CM

## 2016-11-06 DIAGNOSIS — E781 Pure hyperglyceridemia: Secondary | ICD-10-CM

## 2016-11-06 DIAGNOSIS — E034 Atrophy of thyroid (acquired): Secondary | ICD-10-CM

## 2016-11-27 ENCOUNTER — Ambulatory Visit
Admission: RE | Admit: 2016-11-27 | Discharge: 2016-11-27 | Disposition: A | Discharge status: Home / Self Care | Payer: MEDICARE

## 2016-11-27 ENCOUNTER — Ambulatory Visit
Admission: RE | Admit: 2016-11-27 | Discharge: 2016-11-27 | Disposition: A | Discharge status: Home / Self Care | Payer: MEDICARE | Attending: Hematology & Oncology | Admitting: Hematology & Oncology

## 2016-11-27 DIAGNOSIS — C61 Malignant neoplasm of prostate: Principal | ICD-10-CM

## 2016-11-27 DIAGNOSIS — C7951 Secondary malignant neoplasm of bone: Secondary | ICD-10-CM

## 2016-12-19 MED ORDER — ENZALUTAMIDE 40 MG CAPSULE: 120 mg | capsule | Freq: Every day | 11 refills | 0 days | Status: AC

## 2016-12-19 MED ORDER — ENZALUTAMIDE 40 MG CAPSULE: each | 11 refills | 0 days

## 2016-12-19 MED ORDER — ENZALUTAMIDE 40 MG CAPSULE
Freq: Every day | ORAL | 11 refills | 0.00000 days | Status: CP
Start: 2016-12-19 — End: 2017-12-26

## 2017-01-01 MED ORDER — CHLORTHALIDONE 25 MG TABLET
ORAL_TABLET | 1 refills | 0 days | Status: CP
Start: 2017-01-01 — End: 2017-03-08

## 2017-01-11 MED ORDER — LEVOTHYROXINE 88 MCG TABLET
ORAL_TABLET | Freq: Every day | ORAL | 1 refills | 0.00000 days | Status: CP
Start: 2017-01-11 — End: 2017-07-08

## 2017-01-20 NOTE — Unmapped (Signed)
[employer] APPROVED FOR $0 WITH GRANT

## 2017-01-21 NOTE — Unmapped (Signed)
Left a message for Mr. Kina to call me back about [name] access, patient will need to fill on grant funds prior to re-enrollment in [employer] manufacturer assistance for 2019.     Laverna Peace PharmD, BCOP, CPP  Hematology/Oncology Pharmacist  P: [phone]

## 2017-01-22 ENCOUNTER — Other Ambulatory Visit: Admit: 2017-01-22 | Discharge: 2017-01-23 | Payer: MEDICARE

## 2017-01-22 ENCOUNTER — Encounter
Admit: 2017-01-22 | Discharge: 2017-01-23 | Payer: MEDICARE | Attending: Hematology & Oncology | Primary: Hematology & Oncology

## 2017-01-22 DIAGNOSIS — C775 Secondary and unspecified malignant neoplasm of intrapelvic lymph nodes: Secondary | ICD-10-CM

## 2017-01-22 DIAGNOSIS — C7951 Secondary malignant neoplasm of bone: Secondary | ICD-10-CM

## 2017-01-22 DIAGNOSIS — C61 Malignant neoplasm of prostate: Principal | ICD-10-CM

## 2017-01-22 LAB — ALBUMIN: Albumin:MCnc:Pt:Ser/Plas:Qn:: 4.2

## 2017-01-22 LAB — CBC W/ AUTO DIFF
BASOPHILS ABSOLUTE COUNT: 0 10*9/L (ref 0.0–0.1)
EOSINOPHILS ABSOLUTE COUNT: 0.1 10*9/L (ref 0.0–0.4)
HEMATOCRIT: 39.9 % — ABNORMAL LOW (ref 41.0–53.0)
HEMOGLOBIN: 13.6 g/dL (ref 13.5–17.5)
LARGE UNSTAINED CELLS: 2 % (ref 0–4)
LYMPHOCYTES ABSOLUTE COUNT: 0.8 10*9/L — ABNORMAL LOW (ref 1.5–5.0)
MEAN CORPUSCULAR HEMOGLOBIN CONC: 34 g/dL (ref 31.0–37.0)
MEAN CORPUSCULAR HEMOGLOBIN: 32.3 pg (ref 26.0–34.0)
MEAN PLATELET VOLUME: 7.5 fL (ref 7.0–10.0)
MONOCYTES ABSOLUTE COUNT: 0.2 10*9/L (ref 0.2–0.8)
NEUTROPHILS ABSOLUTE COUNT: 2.3 10*9/L (ref 2.0–7.5)
PLATELET COUNT: 206 10*9/L (ref 150–440)
RED BLOOD CELL COUNT: 4.21 10*12/L — ABNORMAL LOW (ref 4.50–5.90)
RED CELL DISTRIBUTION WIDTH: 13.7 % (ref 12.0–15.0)
WBC ADJUSTED: 3.6 10*9/L — ABNORMAL LOW (ref 4.5–11.0)

## 2017-01-22 LAB — CREATININE
CREATININE: 0.7 mg/dL (ref 0.70–1.30)
Creatinine:MCnc:Pt:Ser/Plas:Qn:: 0.7
EGFR MDRD AF AMER: >=60 mL/min/{1.73_m2} (ref >=60)

## 2017-01-22 LAB — CALCIUM
CALCIUM: 9.5 mg/dL (ref 8.5–10.2)
Calcium:MCnc:Pt:Ser/Plas:Qn:: 9.5

## 2017-01-22 LAB — PROSTATE SPECIFIC ANTIGEN: Prostate specific Ag:MCnc:Pt:Ser/Plas:Qn:: 0.5

## 2017-01-22 LAB — PHOSPHORUS: Phosphate:MCnc:Pt:Ser/Plas:Qn:: 4.4

## 2017-01-22 LAB — MEAN CORPUSCULAR HEMOGLOBIN CONC: Lab: 34

## 2017-01-22 MED FILL — XTANDI/40MG/CAPS: XTANDI/40MG/CAPS | 30 days supply | Qty: 90 | Fill #0

## 2017-01-22 NOTE — Unmapped (Signed)
Venipuncture RAC, labs drawn and sent.  To MD appt.

## 2017-01-22 NOTE — Unmapped (Signed)
Lab Results   Component Value Date    PSA 0.42 11/27/2016    PSA 0.40 10/09/2016    PSA 0.41 08/28/2016    PSA 0.45 07/16/2016    PSA 0.41 05/28/2016    PSA 0.50 04/16/2016     Please call [phone] to reach my nurse navigator Mauricia Area for any issues.    For emergencies on Nights, Weekends and Holidays  Call [phone] and ask for the hematology/oncology on call.    Griffin Basil, MD, PhD  Associate Professor of Medicine  Division of Hematology-Oncology    [facility]  Genitourinary Oncology Clinic  Nurse Navigator: Mauricia Area  Fax: [phone]

## 2017-01-22 NOTE — Unmapped (Signed)
Clinical Pharmacist Practitioner: [facility]    Oral Hormonal Therapy Follow-Up    Assessment and recommendations:  1. Enzalutamide monitoring and side effect management- Michael [name] is doing well with no side effects that he can note. Briefly reviewed side effects with Mr. Lesslie he has been taking this medication for over 1 year.  - Continue Enzalutamide 120 mg daily    Follow- up: 6 months in clinic    ______________________________________________________________________    Oral Hormonal Therapy: Enzalutamide 120 mg daily      Start date: 06/16/15    HPI: Metastatic castration resistant prostate cancer, with bone metastasis and path fracture of L4 as well as lymphadenopathy in the retroperitoneum and pelvis. On firstline treatment with enzalutamide as well as s/p palliative RT to L3-4 bon lesion 7/11.  PSA continues to be stably low in response to enzalutamide.  His PSA is generally low, likely because it's less differentiated vs neuroendocrine component.    Interim History: Michael [name] is doing very well on enzalutamide he has no complaints.     Adherence: No missed doses    Toxicities: none    Drug-Drug Interactions: none    Other issues discussed: Discussed access issues, he will be receiving his medication from [facility] under a grant prior to re-enrollment through manufacturer assistance.     Medications:  Current Outpatient Prescriptions   Medication Sig Dispense Refill   ??? acetaminophen (TYLENOL) 325 MG tablet Take 650 mg by mouth every morning.      ??? aspirin (ECOTRIN) 81 MG tablet Take 81 mg by mouth daily.     ??? CALCIUM CARBONATE/VITAMIN D3 (CALCIUM 600 WITH VITAMIN D3 ORAL) Take 2 tablets by mouth once daily.      ??? chlorthalidone (HYGROTON) 25 MG tablet TAKE 1/2 (ONE-HALF) TABLET BY MOUTH ONCE DAILY 45 tablet 1   ??? CHOLECALCIFEROL, VITAMIN D3, (VITAMIN D3 ORAL) Take 1 capsule by mouth once daily. Unsure of dose     ??? enzalutamide (XTANDI) 40 mg cap capsule Take 3 capsules (120 mg total) by mouth daily. 90 capsule 11   ??? gabapentin (NEURONTIN) 300 MG capsule TAKE 1 CAPSULE BY MOUTH TWICE DAILY 180 capsule 1   ??? levothyroxine (SYNTHROID, LEVOTHROID) 88 MCG tablet Take 1 tablet (88 mcg total) by mouth daily. 90 tablet 1   ??? lovastatin (MEVACOR) 40 MG tablet TAKE ONE TABLET BY MOUTH ONCE DAILY WITH MEALS IN THE EVENING 90 tablet 1   ??? vitamin B comp and C no.3 15-10-50-5-300 mg TbER Take 1 tablet by mouth once daily.      ??? hydroCHLOROthiazide (HYDRODIURIL) 12.5 MG tablet Take 12.5 mg by mouth.       No current facility-administered medications for this visit.      I spent 10 minutes with MichaelFritcher in direct patient care.     Laverna Peace PharmD, BCOP, CPP  Hematology/Oncology Pharmacist  P: [phone]    Next Scheduled Delivery Date: 01/24/17 from [facility]   Confirmed Shipping address: [address]  [facility] [address]    Financial/Shipment   Primary Billing: [facility]   Secondary Billing [place], [place], CoPay Card, Secondary Commercial): [employer] Bucker funding and will be re-enrolled in manufacturer assistance  Anticipated copay of $0 reviewed with patient (See full details under Referrals tab in EPIC).    Verified delivery address in FSI and reviewed medication storage requirement.    The following was explained to the patient:  Advised patient of the  following:  -A Welcome packet will be sent to the patient   -Assignment of Benefit for the patient to review and return before next refill  -Arrangement of payment method can be done by contacting the pharmacy  -Take medications with during travel, have doctor's appointments, or if being admitted to the hospital.    Advised patient of refill order process:  Specialty pharmacy process for medication shipment was also reviewed.  Discussed the service provided by [facility] with respects to monthly outbound calls to the patient to set up refill deliveries 7-10 days prior to their subsequent needed refill.  Emphasized need for patient to be reachable in order to schedule medication shipment and that shipment will be shipped to the deliverable address provided via UPS.  Informed patient that welcome packet will be sent.        Provided the [facility] pharmacy contact information below:  [facility] [phone], option 4)    Patient Specific   The following items were reviewed and no issues were identified:  Complete medication list, PMH, allergies    Relevant cultural assessment and health literacy was completed as below:  Patient speaks  English, has no other identified physical or cognitive barriers and is able to store his/her medication as directed.  Patient prefers to have medications discussed with  Patient.  Patient is able to read and understand education materials at a high school level or above.
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
## 2017-01-31 NOTE — Unmapped (Signed)
Made pt aware of current PSA level and Dr. Ainsley Spinner note regarding it. No further questions.

## 2017-01-31 NOTE — Unmapped (Signed)
Hi,    Patient, Michael [name], is requesting to speak to the navigator directly regarding the results of his PSA test from his last visit.      Thank you,  Drema Balzarine  [facility]  [phone]

## 2017-02-15 NOTE — Unmapped (Signed)
[facility] Refill Coordination Note  Specialty Medication(s): Xtandi 40mg   Additional Medications shipped: none    Michael [name], DOB: March 18, 1935  Phone: [phone] (home) , Alternate phone contact: N/A  Phone or address changes today?: No  All above HIPAA information was verified with patient.  Shipping Address: [address]  [facility] [place] [address]   Insurance changes? No    Completed refill call assessment today to schedule patient's medication shipment from the [facility] [phone]).      Confirmed the medication and dosage are correct and have not changed: Yes, regimen is correct and unchanged.    Confirmed patient started or stopped the following medications in the past month:  No, there are no changes reported at this time.    Are you tolerating your medication?:  Michael [name] reports tolerating the medication.    ADHERENCE    Did you miss any doses in the past 4 weeks? No missed doses reported.    FINANCIAL/SHIPPING    Delivery Scheduled: Yes, Expected medication delivery date: 02/20/17     Michael [name] did not have any additional questions at this time.    Delivery address validated in FSI scheduling system: Yes, address listed in FSI is correct.    We will follow up with patient monthly for standard refill processing and delivery.      Thank you,  Michael [name]   [facility] Specialty Pharmacist

## 2017-02-18 MED FILL — XTANDI/40MG/CAPS: XTANDI/40MG/CAPS | 30 days supply | Qty: 90 | Fill #1

## 2017-03-05 ENCOUNTER — Encounter
Admit: 2017-03-05 | Discharge: 2017-03-05 | Payer: MEDICARE | Attending: Hematology & Oncology | Primary: Hematology & Oncology

## 2017-03-05 ENCOUNTER — Encounter: Admit: 2017-03-05 | Discharge: 2017-03-05 | Payer: MEDICARE

## 2017-03-05 DIAGNOSIS — C61 Malignant neoplasm of prostate: Principal | ICD-10-CM

## 2017-03-05 DIAGNOSIS — C7951 Secondary malignant neoplasm of bone: Secondary | ICD-10-CM

## 2017-03-05 DIAGNOSIS — I10 Essential (primary) hypertension: Secondary | ICD-10-CM

## 2017-03-05 DIAGNOSIS — C775 Secondary and unspecified malignant neoplasm of intrapelvic lymph nodes: Secondary | ICD-10-CM

## 2017-03-05 LAB — PROSTATE SPECIFIC ANTIGEN: Prostate specific Ag:MCnc:Pt:Ser/Plas:Qn:: 0.53

## 2017-03-05 LAB — CREATININE: EGFR MDRD NON AF AMER: >=60 mL/min/{1.73_m2} (ref >=60)

## 2017-03-05 LAB — CBC W/ AUTO DIFF
BASOPHILS ABSOLUTE COUNT: 0 10*9/L (ref 0.0–0.1)
BASOPHILS RELATIVE PERCENT: 0.8 %
EOSINOPHILS ABSOLUTE COUNT: 0.1 10*9/L (ref 0.0–0.4)
EOSINOPHILS RELATIVE PERCENT: 2.9 %
HEMOGLOBIN: 12.5 g/dL — ABNORMAL LOW (ref 13.5–17.5)
LARGE UNSTAINED CELLS: 2 % (ref 0–4)
LYMPHOCYTES ABSOLUTE COUNT: 0.7 10*9/L — ABNORMAL LOW (ref 1.5–5.0)
MEAN CORPUSCULAR HEMOGLOBIN CONC: 33.9 g/dL (ref 31.0–37.0)
MEAN CORPUSCULAR HEMOGLOBIN: 32.6 pg (ref 26.0–34.0)
MEAN CORPUSCULAR VOLUME: 96 fL (ref 80.0–100.0)
MEAN PLATELET VOLUME: 7.4 fL (ref 7.0–10.0)
MONOCYTES ABSOLUTE COUNT: 0.3 10*9/L (ref 0.2–0.8)
MONOCYTES RELATIVE PERCENT: 10 %
NEUTROPHILS ABSOLUTE COUNT: 1.9 10*9/L — ABNORMAL LOW (ref 2.0–7.5)
NEUTROPHILS RELATIVE PERCENT: 61.6 %
PLATELET COUNT: 195 10*9/L (ref 150–440)
RED BLOOD CELL COUNT: 3.83 10*12/L — ABNORMAL LOW (ref 4.50–5.90)
RED CELL DISTRIBUTION WIDTH: 13.5 % (ref 12.0–15.0)
WBC ADJUSTED: 3.1 10*9/L — ABNORMAL LOW (ref 4.5–11.0)

## 2017-03-05 LAB — ALBUMIN: Albumin:MCnc:Pt:Ser/Plas:Qn:: 4.2

## 2017-03-05 LAB — EGFR MDRD AF AMER: Glomerular filtration rate/1.73 sq M.predicted.black:ArVRat:Pt:Ser/Plas/Bld:Qn:Creatinine-based formula (MDRD): >=60

## 2017-03-05 LAB — MEAN CORPUSCULAR HEMOGLOBIN CONC: Lab: 33.9

## 2017-03-05 LAB — CALCIUM
CALCIUM: 9.1 mg/dL (ref 8.5–10.2)
Calcium:MCnc:Pt:Ser/Plas:Qn:: 9.1

## 2017-03-05 LAB — PHOSPHORUS: Phosphate:MCnc:Pt:Ser/Plas:Qn:: 3.7

## 2017-03-05 NOTE — Unmapped (Signed)
Labs drawn and sent for analysis.
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
## 2017-03-05 NOTE — Unmapped (Signed)
Lab Results   Component Value Date    PSA 0.53 03/05/2017    PSA 0.50 01/22/2017    PSA 0.42 11/27/2016    PSA 0.40 10/09/2016    PSA 0.41 08/28/2016    PSA 0.45 07/16/2016   Continue Lupron, denosumab (bone shot), and Xtandi.    Please call [phone] to reach my nurse navigator Mauricia Area for any issues.    For emergencies on Nights, Weekends and Holidays  Call [phone] and ask for the hematology/oncology on call.    Griffin Basil, MD, PhD  Associate Professor of Medicine  Division of Hematology-Oncology    [facility]  [facility]it[facility]  Nurse Navigator: Mauricia Area  Fax: [phone]

## 2017-03-08 ENCOUNTER — Encounter: Admit: 2017-03-08 | Discharge: 2017-03-09 | Payer: MEDICARE | Attending: Family | Primary: Family

## 2017-03-08 DIAGNOSIS — E781 Pure hyperglyceridemia: Secondary | ICD-10-CM

## 2017-03-08 DIAGNOSIS — I10 Essential (primary) hypertension: Principal | ICD-10-CM

## 2017-03-08 DIAGNOSIS — E034 Atrophy of thyroid (acquired): Secondary | ICD-10-CM

## 2017-03-08 MED ORDER — CHLORTHALIDONE 25 MG TABLET
ORAL_TABLET | Freq: Every day | ORAL | 3 refills | 0.00000 days | Status: CP
Start: 2017-03-08 — End: 2018-03-14

## 2017-03-08 MED ORDER — LOVASTATIN 40 MG TABLET
ORAL_TABLET | Freq: Every day | ORAL | 1 refills | 0.00000 days | Status: CP
Start: 2017-03-08 — End: 2017-07-08
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
## 2017-03-12 NOTE — Unmapped (Signed)
Pt still has about a month supply of Xtandi.  I will call him back in a month.

## 2017-03-21 NOTE — Unmapped (Signed)
Patient AWV scheduled for 03/29/17 with Dahlia Client

## 2017-03-28 NOTE — Unmapped (Signed)
Name:  Michael [name]  DOB: Jun 24, 1935  Today's Date: 03/29/2017  Age:  82 y.o.    Assessment/Plan:      Personalized Prevention Plan: A personalized prevention plan was reviewed with the patient and a written copy was provided for personal records (available for review in Patient Instructions).    Risks identified:    -- Fall risk:  Slightly abnormal Get Up and Go assessment, pt denies falls or unsteadiness, uses walker to ambulate, pt was in PT earlier this year, continues to do home exercises to increase balance and flexibility; falls prevention education included in AVS.   -- Weight concerns:  BMI > 30 (Obese): Patient will work independently on diet and exercise changes to help lose weight   -- Mini-cog screening: 2 words recalled, abnormal clock drawing, see scan. Pt reports no concerns with cognition. Defer to PCP.     The following preventive services were discussed with the patient:    -- Vaccinations: Shingrix: declined    Other Exam:  none    Barriers to goals identified and addressed. Pertinent handouts were given today and reviewed with the patient as indicated.  The Care Plan and Self-Management goals have been included on the AVS and the AVS has been printed.   I encouraged the patient to keep regular logs for me to review at their next visit; patient states they have a log at home blood pressure.. Any outside resources or referrals needed at this time are noted above. Patient's current medications have been reviewed.  Patient voiced understanding and all questions have been answered to satisfaction.     Provider to discuss treatment options and their associated risks/benefits with the patient at their next appointment. Provider to discuss preventive services with the patient. Refer to Provider documentation.    Subjective/Objective:      Patient ID: Michael [name] is a 82 y.o. male who presents for an Annual Wellness Visit .     The patient came to the appointment independently.    Medications, allergies, medical history, surgical history, family history,  social history, and previous hospitalization history were reviewed and updated in EMR today.    Vitals and BMI were reviewed.     Preventative health screening tests and immunization records were reviewed.  Appropriate age related screening tests were ordered today if patient is due and agreeable to have tests completed.  See assessment/plan.    Current Providers:     Patient Care Team:  Loran Senters, FNP as PCP - General (Family Medicine)  Loran Senters, FNP as PCP - Rande Brunt  Marica Otter, RN as Registered Nurse (Oncology)  Maurie Boettcher, MD (Oncology)    Health Risk Assessment (HRA):     Balance and gait assessment:  Timed Get up an Go to perform walk a distance of 10 ft.: 11.5 seconds. This falls in the Normal range. Results discussed with patient. If abnormal, provider notified.      Normative Reference Values by Age   Time in Seconds    11 ??? 69 years  8.1 (7.1 ??? 9.0)    70 ??? 79 years  9.2 (8.2 ??? 10.2)    80 ??? 99 years  11.3 (10.0 ??? 12.7)      Cognitive Assessment:    Mini-Cog score: 1-2 words recalled, with abnormal clock drawing. See scan.     Health Literacy: How confident are you that you understand your health issues/concerns, can participate in your care, and manage your  care along with your physician: confident.    Safety & Fall Risk:    1) Any falls in the past year? No    2) Any use of assistive devices to ambulate? Yes    Current Suppliers (DME):    Walker and BP machine    Functional ability evaluation:    1) Does patient need assistance with ADLs, managing finances, remembering to take medications, using the phone, shopping for food, making meals, and performing housework? No    Educated patient on the importance of carrying a medication list with them at all times.    2) Is patient is incontinent of urine or stool? No    3) Does the patient still drive? Yes  If yes, are you having difficulties driving? No.    Does the patient fasten their seat belt?Yes.    Hearing loss screen:    1)  Hearing Test n/a    2) Does pt wear hearing aids? No    3)Does patient have to strain or struggle to hear/understand conversations? No    Exercise and Nutrition:     1) Has patient had any unplanned weight loss in the last 3 months? No    2) Does the patient exercise and if yes, how often? Yes, pt exercises by staying active around home, walking, PT exercises for strength and flexibility and balance .    3) Does patient have a pressure ulcer or non healing wound? No    4) Would patient like to be referred to nutrition if they have a calculated body mass index < 18.5 or > 30? No    Estimated body mass index is 32.48 kg/m?? as calculated from the following:    Height as of this encounter: 174 cm (5' 8.5).    Weight as of this encounter: 98.3 kg (216 lb 12.8 oz).  Facility age limit for growth percentiles is 20 years.      End of life planning:     03/28/2017    Michael Pick, FNP (change to Billing Provider if needed) was present and immediately available in office suite.    The patient reports interest in education materials about a Healthcare power of attorney Living Will. Materials provided at today's visit. .      Case Manager facilitated discussion with patient and no visitors about advance care planning and documentation including [place] Practical Advance Form . The patient voluntarily agreed to complete and bring to office [place] Practical Advance Form.   Note: forms that require notarization require two witnessess that are non-family members nor health care workers.      Psychosocial risks:    Social history updated and reviewed.    Past history of Psychiatric illness: No    PHQ-2:    Patient completed a PHQ-2, with a score of  0.   Discussed and reviewed results with the patient.     Michael [name]  03/29/2017      Name: Michael [name]  DOB: [birth year]  Today's Date: 03/29/2017  Age: 82 y.o.      MEDICARE SCREENING & PREVENTION GUIDELINES RECOMMENDATIONS DATE UP-TO-DATE OR   TO BE REASSESSED   AAA Ultrasound Once in men age 62 to 52 who have ever smoked or currently smoke.  never a smoker   Colorectal Cancer Screening Patients 50 to 75: stool cards annually OR colonoscopy every 10 years (or more frequently if high risk) OR FIT-DNA every 3 years.     not within recommended  age    DEXA Bone Density Measurement Patients age 73-85 to have a DEXA every 5 years in postmenopausal women, males will defer to PCP.  defer to PCP   Diabetes Eye Exam  Annually if Diabetic, or every 2 years if prior exam did not show retinopathy in both eyes.  not applicable    Diabetes Foot Exam  Annually if Diabetic.  not applicable    Diabetes Screening (fasting glucose) Annually for patients age 65-70. If risk factors (family hx of DM, hx of gestational DM, and/or PCOS), or diagnosed with pre-diabetes, twice per year. Normal fasting glucose range: 65-99. If diagnosed with diabetes, not applicable. Lab Results   Component Value Date    GLU 94 04/16/2016          defer to PCP   Heart Disease Screening (fasting lipid panel) Minimum of every 5 years, patients age 30-75,  if no apparent signs or symptoms of heart disease. If diagnosed with hyperlipidemia, not applicable. Lab Results   Component Value Date/Time    CHOL 143 10/28/2014 10:57 AM    CHOL 154 04/28/2014 10:13 AM    LDL 73 10/28/2014 10:57 AM    LDL 84 04/28/2014 10:13 AM    LDL 88 04/05/2010 04:20 PM    N/A d/t diagnosis   Mammogram Screening (women) Age 65-74 every 2 years.   not applicable    Pelvic Exam & Pap Smear  (women) Women ages 32 to 29 every 3 years with negative cytology (pap smear)  OR,   women ages 83 to 5 every 5 years if they have had both a negative pap and human papillomavirus (HPV) OR,  Every 3 years if they had a positive HPV result.        not applicable    Hepatitis C Screening  A one-time screening for HCV infection for adults born between 1945 & 1965.  not applicable    Tdap Every 10 years (will not be covered by Medicare) 11/04/12 up to date    Influenza Vaccine Annually  10/09/16 up to date    Prevnar 13 Prevnar given at age 62 and Pneumovax given one year later. These vaccines may be given in a different sequence depending on chronic conditions. (utilize BPA for dosing & administration) 11/04/13 complete    Pneumovax 23 Prevnar given at age 37 and Pneumovax given one year later. These vaccines may be given in a different sequence depending on chronic conditions. (utilize BPA for dosing & administration) 11/04/12 complete    Zostavax Vaccine Once at age 17 or older (may not be covered by Medicare).   due for Shingrix, inquire at your pharmacy       Immunization History   Administered Date(s) Administered   ??? Influenza Vaccine Quad (IIV4 PF) 77mo+ injectable 10/28/2015, 10/09/2016   ??? Influenza, High Dose (IIV3) 65 yrs & older 11/04/2013, 10/28/2014   ??? PNEUMOCOCCAL POLYSACCHARIDE 23 11/04/2012   ??? Pneumococcal Conjugate 13-Valent 11/04/2013   ??? TdaP 11/04/2012

## 2017-03-29 ENCOUNTER — Encounter: Admit: 2017-03-29 | Discharge: 2017-03-30 | Payer: MEDICARE | Attending: Clinical | Primary: Clinical

## 2017-03-29 DIAGNOSIS — Z0000 Encounter for general adult medical examination without abnormal findings: Principal | ICD-10-CM

## 2017-03-29 NOTE — Unmapped (Addendum)
Please bring in a copy of your Advanced Directive once you've had a chance to complete.      MEDICARE SCREENING & PREVENTION GUIDELINES RECOMMENDATIONS DATE UP-TO-DATE OR   TO BE REASSESSED   AAA Ultrasound Once in men age 80 to 36 who have ever smoked or currently smoke.  never a smoker   Colorectal Cancer Screening Patients 50 to 75: stool cards annually OR colonoscopy every 10 years (or more frequently if high risk) OR FIT-DNA every 3 years.     not within recommended age    DEXA Bone Density Measurement Patients age 63-85 to have a DEXA every 5 years in postmenopausal women, males will defer to PCP.  defer to PCP   Diabetes Eye Exam  Annually if Diabetic, or every 2 years if prior exam did not show retinopathy in both eyes.  not applicable    Diabetes Foot Exam  Annually if Diabetic.  not applicable    Diabetes Screening (fasting glucose) Annually for patients age 45-70. If risk factors (family hx of DM, hx of gestational DM, and/or PCOS), or diagnosed with pre-diabetes, twice per year. Normal fasting glucose range: 65-99. If diagnosed with diabetes, not applicable. Lab Results   Component Value Date    GLU 94 04/16/2016          defer to PCP   Heart Disease Screening (fasting lipid panel) Minimum of every 5 years, patients age 60-75,  if no apparent signs or symptoms of heart disease. If diagnosed with hyperlipidemia, not applicable. Lab Results   Component Value Date/Time    CHOL 143 10/28/2014 10:57 AM    CHOL 154 04/28/2014 10:13 AM    LDL 73 10/28/2014 10:57 AM    LDL 84 04/28/2014 10:13 AM    LDL 88 04/05/2010 04:20 PM    N/A d/t diagnosis   Mammogram Screening (women) Age 74-74 every 2 years.   not applicable    Pelvic Exam & Pap Smear  (women) Women ages 60 to 57 every 3 years with negative cytology (pap smear)  OR,   women ages 58 to 58 every 5 years if they have had both a negative pap and human papillomavirus (HPV) OR,  Every 3 years if they had a positive HPV result.        not applicable Hepatitis C Screening  A one-time screening for HCV infection for adults born between 1945 & 1965.  not applicable    Tdap Every 10 years (will not be covered by Medicare) 11/04/12 up to date    Influenza Vaccine Annually  10/09/16 up to date    Prevnar 13 Prevnar given at age 92 and Pneumovax given one year later. These vaccines may be given in a different sequence depending on chronic conditions. (utilize BPA for dosing & administration) 11/04/13 complete    Pneumovax 23 Prevnar given at age 45 and Pneumovax given one year later. These vaccines may be given in a different sequence depending on chronic conditions. (utilize BPA for dosing & administration) 11/04/12 complete    Zostavax Vaccine Once at age 103 or older (may not be covered by Medicare).   due for Shingrix, inquire at your pharmacy            Well Visit, Over 65: Care Instructions  Your Care Instructions    Physical exams can help you stay healthy. Your doctor has checked your overall health and may have suggested ways to take good care of yourself. He or she also may have recommended  tests. At home, you can help prevent illness with healthy eating, regular exercise, and other steps.  Follow-up care is a key part of your treatment and safety. Be sure to make and go to all appointments, and call your doctor if you are having problems. It's also a good idea to know your test results and keep a list of the medicines you take.  How can you care for yourself at home?  ?? Reach and stay at a healthy weight. This will lower your risk for many problems, such as obesity, diabetes, heart disease, and high blood pressure.  ?? Get at least 30 minutes of exercise on most days of the week. Walking is a good choice. You also may want to do other activities, such as running, swimming, cycling, or playing tennis or team sports.  ?? Do not smoke. Smoking can make health problems worse. If you need help quitting, talk to your doctor about stop-smoking programs and medicines. These can increase your chances of quitting for good.  ?? Protect your skin from too much sun. When you're outdoors from 10 a.m. to 4 p.m., stay in the shade or cover up with clothing and a hat with a wide brim. Wear sunglasses that block UV rays. Even when it's cloudy, put broad-spectrum sunscreen (SPF 30 or higher) on any exposed skin.  ?? See a dentist one or two times a year for checkups and to have your teeth cleaned.  ?? Wear a seat belt in the car.  ?? Limit alcohol to 2 drinks a day for men and 1 drink a day for women. Too much alcohol can cause health problems.  Follow your doctor's advice about when to have certain tests. These tests can spot problems early.  For men and women  ?? Cholesterol. Your doctor will tell you how often to have this done based on your overall health and other things that can increase your risk for heart attack and stroke.  ?? Blood pressure. Have your blood pressure checked during a routine doctor visit. Your doctor will tell you how often to check your blood pressure based on your age, your blood pressure results, and other factors.  ?? Diabetes. Ask your doctor whether you should have tests for diabetes.  ?? Vision. Experts recommend that you have yearly exams for glaucoma and other age-related eye problems.  ?? Hearing. Tell your doctor if you notice any change in your hearing. You can have tests to find out how well you hear.  ?? Colon cancer tests. Keep having colon cancer tests as your doctor recommends. You can have one of several types of tests.  ?? Heart attack and stroke risk. At least every 4 to 6 years, you should have your risk for heart attack and stroke assessed. Your doctor uses factors such as your age, blood pressure, cholesterol, and whether you smoke or have diabetes to show what your risk for a heart attack or stroke is over the next 10 years.  ?? Osteoporosis. Talk to your doctor about whether you should have a bone density test to find out whether you have thinning bones. Also ask your doctor about whether you should take calcium and vitamin D supplements.  For women  ?? Pap test and pelvic exam. You may no longer need a Pap test. Talk with your doctor about whether to stop or continue to have Pap tests.  ?? Breast exam and mammogram. Ask how often you should have a mammogram, which is an X-ray of  your breasts. A mammogram can spot breast cancer before it can be felt and when it is easiest to treat.  ?? Thyroid disease. Talk to your doctor about whether to have your thyroid checked as part of a regular physical exam. Women have an increased chance of a thyroid problem.  For men  ?? Prostate exam. Talk to your doctor about whether you should have a blood test (called a PSA test) for prostate cancer. Experts disagree on whether men should have this test. Some experts recommend that you discuss the benefits and risks of the test with your doctor.  ?? Abdominal aortic aneurysm. Ask your doctor whether you should have a test to check for an aneurysm. You may need a test if you ever smoked or if your parent, brother, sister, or child has had an aneurysm.  When should you call for help?  Watch closely for changes in your health, and be sure to contact your doctor if you have any problems or symptoms that concern you.  Where can you learn more?  Go to [facility] at [email or web address]  Select Preferences in the upper right hand corner, then select Health Library under Resources. Enter [phone] in the search box to learn more about Well Visit, Over 65: Care Instructions.  Current as of: April 11, 2016  Content Version: 11.9  ?? 2006-2018 [employer]. Care instructions adapted under license by [facility] ([place]). If you have questions about a medical condition or this instruction, always ask your healthcare professional. [employer] disclaims any warranty or liability for your use of this information.         Preventing Outdoor Falls: Care Instructions  Your Care Instructions    Worries about falls don't need to keep you indoors. Outdoor activities like walking have big benefits for your health. You will need to watch your step and learn a few safety measures.  If you are worried about having a fall outdoors, ask your doctor about exercises, classes, or physical therapy that may help. You can learn ways to gain strength, flexibility, and balance. Ask if it might help to use a cane or walker.  You can make your time outdoors safer with a few simple measures.  Follow-up care is a key part of your treatment and safety. Be sure to make and go to all appointments, and call your doctor if you are having problems. It's also a good idea to know your test results and keep a list of the medicines you take.  How can you prevent falls outdoors?  ?? Wear shoes with firm soles and low heels. If you have to walk on an icy surface, use grippers that can be worn over your shoes in bad weather.  ?? Be extra careful if weather is bad. Walk on the grass when the sidewalks are slick. If you live in a place that gets snow and ice in the winter, sprinkle salt on slippery stairs and sidewalks.  ?? Be careful getting on or off buses and trains or getting in and out of cars. If handrails are available, use them.  ?? Be careful when you cross the street. Look for crosswalks or places where curb cuts or ramps are present.  ?? Try not to hurry, especially if you are carrying something.  ?? Be cautious in parking lots or garages. There may be curbs or changes in pavement, or the height of the pavement may vary.  ?? Make sure to wear the correct eyeglasses, if you need them.  Reading glasses or bifocals can make it harder to see hazards that might be in your way.  ?? If you are walking outdoors for exercise, try to:  ? Walk in well-lighted, well-maintained areas. These include high school or college tracks, shopping malls, and public spaces.  ? Walk with a partner.  ? Watch out for cracked sidewalks, curbs, changes in the height of the pavement, exposed tree roots, and debris such as fallen leaves or branches.  Where can you learn more?  Go to [facility] at [email or web address]  Select Preferences in the upper right hand corner, then select Health Library under Resources. Enter [phone] in the search box to learn more about Preventing Outdoor Falls: Care Instructions.  Current as of: March 29, 2016  Content Version: 11.9  ?? 2006-2018 [employer]. Care instructions adapted under license by [facility]. If you have questions about a medical condition or this instruction, always ask your healthcare professional. [employer] disclaims any warranty or liability for your use of this information.

## 2017-04-10 MED FILL — XTANDI/40MG/CAPS: XTANDI/40MG/CAPS | 30 days supply | Qty: 90 | Fill #2

## 2017-04-10 NOTE — Unmapped (Signed)
[facility] Refill Coordination Note  Specialty Medication(s): XTANDI 40 MG    Michael [name], DOB: Feb 28, 1935  Phone: [phone] (home) , Alternate phone contact: N/A  Phone or address changes today?: No  All above HIPAA information was verified with patient.  Shipping Address: [address]  [facility] [place] [address]   Insurance changes? No    Completed refill call assessment today to schedule patient's medication shipment from the [facility] [phone]).      Confirmed the medication and dosage are correct and have not changed: Yes, regimen is correct and unchanged.    Confirmed patient started or stopped the following medications in the past month:  No, there are no changes reported at this time.    Are you tolerating your medication?:  Michael [name] reports tolerating the medication.    ADHERENCE    (Below is required for Medicare Part B or Transplant patients only - per drug):   How many tablets were dispensed last month: 1 MONTH  Patient currently has 2 DOSES remaining.    Did you miss any doses in the past 4 weeks? Yes.  Michael [name] reports missing 1 DOSE days of medication therapy in the last 4 weeks.  Michael [name] reports FORGOT as the cause of their non-adherance.    FINANCIAL/SHIPPING    Delivery Scheduled: Yes, Expected medication delivery date: 032919     The patient will receive an [facility]SI print out for each medication shipped and additional FDA Medication Guides as required.  Patient education from [facility] or Robet Leu may also be included in the shipment    Michael [name] did not have any additional questions at this time.    Delivery address validated in FSI scheduling system: Yes, address listed in FSI is correct.    We will follow up with patient monthly for standard refill processing and delivery.      Thank you,  Antonietta Barcelona   [facility] Specialty Technician

## 2017-04-12 ENCOUNTER — Encounter: Admit: 2017-04-12 | Discharge: 2017-04-14 | Payer: MEDICARE

## 2017-04-12 ENCOUNTER — Encounter: Admit: 2017-04-12 | Discharge: 2017-04-25 | Payer: MEDICARE

## 2017-04-12 DIAGNOSIS — C61 Malignant neoplasm of prostate: Principal | ICD-10-CM

## 2017-04-12 DIAGNOSIS — C7951 Secondary malignant neoplasm of bone: Secondary | ICD-10-CM

## 2017-04-16 ENCOUNTER — Encounter
Admit: 2017-04-16 | Discharge: 2017-04-17 | Payer: MEDICARE | Attending: Hematology & Oncology | Primary: Hematology & Oncology

## 2017-04-16 ENCOUNTER — Encounter: Admit: 2017-04-16 | Discharge: 2017-04-17 | Payer: MEDICARE

## 2017-04-16 DIAGNOSIS — C61 Malignant neoplasm of prostate: Principal | ICD-10-CM

## 2017-04-16 DIAGNOSIS — C7951 Secondary malignant neoplasm of bone: Secondary | ICD-10-CM

## 2017-04-16 DIAGNOSIS — C775 Secondary and unspecified malignant neoplasm of intrapelvic lymph nodes: Secondary | ICD-10-CM

## 2017-04-16 LAB — COMPREHENSIVE METABOLIC PANEL
ALBUMIN: 3.7 g/dL (ref 3.5–5.0)
ALT (SGPT): 16 U/L — ABNORMAL LOW (ref 19–72)
ANION GAP: 9 mmol/L (ref 9–15)
AST (SGOT): 18 U/L — ABNORMAL LOW (ref 19–55)
BILIRUBIN TOTAL: 0.3 mg/dL (ref 0.0–1.2)
BLOOD UREA NITROGEN: 19 mg/dL (ref 7–21)
BUN / CREAT RATIO: 30
CALCIUM: 9.3 mg/dL (ref 8.5–10.2)
CHLORIDE: 102 mmol/L (ref 98–107)
CO2: 30 mmol/L (ref 22.0–30.0)
CREATININE: 0.63 mg/dL — ABNORMAL LOW (ref 0.70–1.30)
EGFR MDRD AF AMER: >=60 mL/min/{1.73_m2} (ref >=60)
EGFR MDRD NON AF AMER: >=60 mL/min/{1.73_m2} (ref >=60)
GLUCOSE RANDOM: 95 mg/dL (ref 65–179)
PROTEIN TOTAL: 6.4 g/dL — ABNORMAL LOW (ref 6.5–8.3)
SODIUM: 141 mmol/L (ref 135–145)

## 2017-04-16 LAB — CBC W/ AUTO DIFF
BASOPHILS ABSOLUTE COUNT: 0 10*9/L (ref 0.0–0.1)
BASOPHILS RELATIVE PERCENT: 0.3 %
EOSINOPHILS RELATIVE PERCENT: 3.9 %
HEMATOCRIT: 39.6 % — ABNORMAL LOW (ref 41.0–53.0)
HEMOGLOBIN: 13.6 g/dL (ref 13.5–17.5)
LARGE UNSTAINED CELLS: 2 % (ref 0–4)
LYMPHOCYTES ABSOLUTE COUNT: 0.7 10*9/L — ABNORMAL LOW (ref 1.5–5.0)
LYMPHOCYTES RELATIVE PERCENT: 17.5 %
MEAN CORPUSCULAR HEMOGLOBIN CONC: 34.4 g/dL (ref 31.0–37.0)
MEAN CORPUSCULAR HEMOGLOBIN: 32.7 pg (ref 26.0–34.0)
MEAN CORPUSCULAR VOLUME: 95 fL (ref 80.0–100.0)
MEAN PLATELET VOLUME: 8 fL (ref 7.0–10.0)
MONOCYTES ABSOLUTE COUNT: 0.3 10*9/L (ref 0.2–0.8)
MONOCYTES RELATIVE PERCENT: 7.1 %
NEUTROPHILS ABSOLUTE COUNT: 2.8 10*9/L (ref 2.0–7.5)
NEUTROPHILS RELATIVE PERCENT: 69.2 %
PLATELET COUNT: 202 10*9/L (ref 150–440)
RED BLOOD CELL COUNT: 4.16 10*12/L — ABNORMAL LOW (ref 4.50–5.90)
RED CELL DISTRIBUTION WIDTH: 13.2 % (ref 12.0–15.0)

## 2017-04-16 LAB — PHOSPHORUS: Phosphate:MCnc:Pt:Ser/Plas:Qn:: 3.1

## 2017-04-16 LAB — AST (SGOT): Aspartate aminotransferase:CCnc:Pt:Ser/Plas:Qn:: 18 — ABNORMAL LOW

## 2017-04-16 LAB — MONOCYTES ABSOLUTE COUNT: Lab: 0.3

## 2017-04-16 LAB — PROSTATE SPECIFIC ANTIGEN: Prostate specific Ag:MCnc:Pt:Ser/Plas:Qn:: 0.57

## 2017-04-16 NOTE — Unmapped (Signed)
Lab Results   Component Value Date    PSA 0.53 03/05/2017    PSA 0.50 01/22/2017    PSA 0.42 11/27/2016    PSA 0.40 10/09/2016    PSA 0.41 08/28/2016    PSA 0.45 07/16/2016   Today's PSA is pending.  Scans are stable (which is good).  Continue with enzalutamide Michael [name]) at the same dose.  Denosumab (Xgeva) injection today.    Please call [phone] to reach my nurse navigator Mauricia Area for any issues.    For emergencies on Nights, Weekends and Holidays  Call [phone] and ask for the hematology/oncology on call.    Griffin Basil, MD, PhD  Associate Professor of Medicine  Division of Hematology-Oncology    [facility]  Genitourinary Oncology Clinic  Nurse Navigator: Mauricia Area  Fax: [phone]
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
## 2017-05-01 NOTE — Unmapped (Signed)
[facility] Refill Coordination Note  Specialty Medication(s): Xtandi 40 mg  Additional Medications shipped: n/a    Michael [name], DOB: 07-20-1935  Phone: [phone] (home) , Alternate phone contact: N/A  Phone or address changes today?: No  All above HIPAA information was verified with patient.  Shipping Address: [address]  [facility] [place] [address]   Insurance changes? No    Completed refill call assessment today to schedule patient's medication shipment from the [facility] [phone]).      Confirmed the medication and dosage are correct and have not changed: Yes, regimen is correct and unchanged.    Confirmed patient started or stopped the following medications in the past month:  No, there are no changes reported at this time.    Are you tolerating your medication?:  Michael [name] reports tolerating the medication.    ADHERENCE    Is this medicine transplant or covered by Medicare Part B? No.        Did you miss any doses in the past 4 weeks? No missed doses reported.    FINANCIAL/SHIPPING    Delivery Scheduled: Yes, Expected medication delivery date: 05/06/17     The patient will receive an FSI print out for each medication shipped and additional FDA Medication Guides as required.  Patient education from [place] or Robet Leu may also be included in the shipment.    Michael [name] did not have any additional questions at this time.    Delivery address validated in FSI scheduling system: Yes, address listed in FSI is correct.    We will follow up with patient monthly for standard refill processing and delivery.      Thank you,  [name] Vangie Bicker   [facility] Specialty Pharmacist

## 2017-05-03 MED FILL — XTANDI/40MG/CAPS: XTANDI/40MG/CAPS | 30 days supply | Qty: 90 | Fill #3

## 2017-05-21 MED ORDER — GABAPENTIN 300 MG CAPSULE
ORAL_CAPSULE | Freq: Two times a day (BID) | ORAL | 1 refills | 0 days | Status: CP
Start: 2017-05-21 — End: 2017-11-06

## 2017-05-30 NOTE — Unmapped (Signed)
[facility] Refill Coordination Note  Specialty Medication(s): Xtandi 40mg    Additional Medications shipped: none    Michael [name], DOB: 1935/12/31  Phone: [phone] (home) , Alternate phone contact: N/A  Phone or address changes today?: No  All above HIPAA information was verified with patient.  Shipping Address: [address]  [facility] [place] [address]   Insurance changes? No    Completed refill call assessment today to schedule patient's medication shipment from the [facility] [phone]).      Confirmed the medication and dosage are correct and have not changed: Yes, regimen is correct and unchanged.    Confirmed patient started or stopped the following medications in the past month:  No, there are no changes reported at this time.    Are you tolerating your medication?:  Michael [name] reports tolerating the medication.    ADHERENCE    (Below is required for Medicare Part B or Transplant patients only - per drug):   How many tablets were dispensed last month: 90  Patient currently has 21 remaining.    Did you miss any doses in the past 4 weeks? No missed doses reported.    FINANCIAL/SHIPPING    Delivery Scheduled: Yes, Expected medication delivery date: 06/04/2017     The patient will receive an FSI print out for each medication shipped and additional FDA Medication Guides as required.  Patient education from [place] or [employer]et Leu may also be included in the shipment    Masin did not have any additional questions at this time.    Delivery address validated in FSI scheduling system: Yes, address listed in FSI is correct.    We will follow up with patient monthly for standard refill processing and delivery.      Thank you,  Breck Coons [facility] Specialty Pharmacist

## 2017-05-30 NOTE — Unmapped (Signed)
i

## 2017-06-03 MED FILL — XTANDI/40MG/CAPS: XTANDI/40MG/CAPS | 30 days supply | Qty: 90 | Fill #4

## 2017-06-04 ENCOUNTER — Encounter: Admit: 2017-06-04 | Discharge: 2017-06-04 | Payer: MEDICARE

## 2017-06-04 ENCOUNTER — Ambulatory Visit
Admit: 2017-06-04 | Discharge: 2017-06-04 | Payer: MEDICARE | Attending: Hematology & Oncology | Primary: Hematology & Oncology

## 2017-06-04 DIAGNOSIS — C61 Malignant neoplasm of prostate: Principal | ICD-10-CM

## 2017-06-04 DIAGNOSIS — C7951 Secondary malignant neoplasm of bone: Secondary | ICD-10-CM

## 2017-06-04 DIAGNOSIS — C775 Secondary and unspecified malignant neoplasm of intrapelvic lymph nodes: Secondary | ICD-10-CM

## 2017-06-04 LAB — CREATININE
CREATININE: 0.65 mg/dL — ABNORMAL LOW (ref 0.70–1.30)
EGFR MDRD NON AF AMER: >=60 mL/min/{1.73_m2} (ref >=60)

## 2017-06-04 LAB — PHOSPHORUS
PHOSPHORUS: 3.8 mg/dL (ref 2.9–4.7)
Phosphate:MCnc:Pt:Ser/Plas:Qn:: 3.8

## 2017-06-04 LAB — EGFR MDRD NON AF AMER: Glomerular filtration rate/1.73 sq M.predicted.non black:ArVRat:Pt:Ser/Plas/Bld:Qn:Creatinine-based formula (MDRD): >=60

## 2017-06-04 LAB — CALCIUM: Calcium:MCnc:Pt:Ser/Plas:Qn:: 9.6

## 2017-06-04 LAB — ALBUMIN: Albumin:MCnc:Pt:Ser/Plas:Qn:: 3.9

## 2017-06-04 LAB — PROSTATE SPECIFIC ANTIGEN: Prostate specific Ag:MCnc:Pt:Ser/Plas:Qn:: 0.58

## 2017-06-04 NOTE — Unmapped (Signed)
Lab Results   Component Value Date    PSA 0.58 06/04/2017    PSA 0.57 04/16/2017    PSA 0.53 03/05/2017    PSA 0.50 01/22/2017    PSA 0.42 11/27/2016    PSA 0.40 10/09/2016   PSA is more or less stable, perhaps very very slowly rising.  No need for changing treatment.    Please call [phone] to reach my nurse navigator Mauricia Area for any issues.    For emergencies on Nights, Weekends and Holidays  Call [phone] and ask for the hematology/oncology on call.    Griffin Basil, MD, PhD  Associate Professor of Medicine  Division of Hematology-Oncology    [facility]  Genitourinary Oncology Clinic  Nurse Navigator: Mauricia Area  Fax: [phone]

## 2017-06-04 NOTE — Unmapped (Signed)
1308:  Labs drawn and sent for analysis.  Care provided by  Eppie Gibson, RN
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
## 2017-06-28 NOTE — Unmapped (Signed)
[facility] Refill Coordination Note  Specialty Medication(s): Xtandi 40mg   Additional Medications shipped: none    Michael [name], DOB: 10-02-1935  Phone: [phone] (home) , Alternate phone contact: N/A  Phone or address changes today?: No  All above HIPAA information was verified with patient.  Shipping Address: [address]  [facility] [facility] [place] [address]   Insurance changes? No    Completed refill call assessment today to schedule patient's medication shipment from the [facility] [phone]).      Confirmed the medication and dosage are correct and have not changed: Yes, regimen is correct and unchanged.    Confirmed patient started or stopped the following medications in the past month:  No, there are no changes reported at this time.    Are you tolerating your medication?:  [name] reports tolerating the medication.    ADHERENCE    Did you miss any doses in the past 4 weeks? No missed doses reported.    FINANCIAL/SHIPPING    Delivery Scheduled: Yes, Expected medication delivery date: 07/09/17     The patient will receive an [facility]SI print out for each medication shipped and additional FDA Medication Guides as required.  Patient education from [place] or Robet Leu may also be included in the shipment    [name] did not have any additional questions at this time.    Delivery address validated in FSI scheduling system: Yes, address listed in FSI is correct.    We will follow up with patient monthly for standard refill processing and delivery.      Thank you,  [name]   [facility] Specialty Pharmacist

## 2017-07-08 ENCOUNTER — Encounter: Admit: 2017-07-08 | Discharge: 2017-07-09 | Payer: MEDICARE | Attending: Family | Primary: Family

## 2017-07-08 DIAGNOSIS — I10 Essential (primary) hypertension: Principal | ICD-10-CM

## 2017-07-08 DIAGNOSIS — I509 Heart failure, unspecified: Secondary | ICD-10-CM

## 2017-07-08 DIAGNOSIS — E781 Pure hyperglyceridemia: Secondary | ICD-10-CM

## 2017-07-08 DIAGNOSIS — E034 Atrophy of thyroid (acquired): Secondary | ICD-10-CM

## 2017-07-08 MED ORDER — LOVASTATIN 40 MG TABLET
ORAL_TABLET | Freq: Every day | ORAL | 3 refills | 0.00000 days | Status: CP
Start: 2017-07-08 — End: 2018-07-18

## 2017-07-08 MED ORDER — LEVOTHYROXINE 88 MCG TABLET
ORAL_TABLET | Freq: Every day | ORAL | 3 refills | 0.00000 days | Status: CP
Start: 2017-07-08 — End: 2018-01-21
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
## 2017-07-09 MED FILL — XTANDI/40MG/CAPS: XTANDI/40MG/CAPS | 30 days supply | Qty: 90 | Fill #5

## 2017-07-16 ENCOUNTER — Encounter
Admit: 2017-07-16 | Discharge: 2017-07-16 | Payer: MEDICARE | Attending: Hematology & Oncology | Primary: Hematology & Oncology

## 2017-07-16 ENCOUNTER — Encounter: Admit: 2017-07-16 | Discharge: 2017-07-16 | Payer: MEDICARE

## 2017-07-16 DIAGNOSIS — C7951 Secondary malignant neoplasm of bone: Secondary | ICD-10-CM

## 2017-07-16 DIAGNOSIS — C61 Malignant neoplasm of prostate: Principal | ICD-10-CM

## 2017-07-16 DIAGNOSIS — C775 Secondary and unspecified malignant neoplasm of intrapelvic lymph nodes: Secondary | ICD-10-CM

## 2017-07-16 LAB — CREATININE
CREATININE: 0.63 mg/dL — ABNORMAL LOW (ref 0.70–1.30)
EGFR CKD-EPI NON-AA MALE: >90 mL/min/{1.73_m2} (ref >=60)

## 2017-07-16 LAB — PHOSPHORUS: Phosphate:MCnc:Pt:Ser/Plas:Qn:: 3.4

## 2017-07-16 LAB — EGFR CKD-EPI AA MALE: Lab: >90

## 2017-07-16 LAB — ALBUMIN: Albumin:MCnc:Pt:Ser/Plas:Qn:: 3.9

## 2017-07-16 LAB — CALCIUM: Calcium:MCnc:Pt:Ser/Plas:Qn:: 9.1

## 2017-07-16 LAB — PROSTATE SPECIFIC ANTIGEN: Prostate specific Ag:MCnc:Pt:Ser/Plas:Qn:: 0.65

## 2017-07-16 NOTE — Unmapped (Signed)
1355:  Labs drawn and sent for analysis.  Care provided by  Eppie Gibson, RN

## 2017-07-16 NOTE — Unmapped (Signed)
Clinical Pharmacist Practitioner: [facility]    Oral Hormonal Therapy Follow-Up    Assessment and recommendations:  1. Enzalutamide monitoring and side effect management- Michael [name] is doing well with no side effects that he can note.   - Continue Enzalutamide 120 mg daily  - Recommended changing his administration time from the afternoon to the morning to improve compliance.     Follow- up: 6 months in clinic    ______________________________________________________________________    Oral Hormonal Therapy: Enzalutamide 120 mg daily      Start date: 06/16/15    HPI: Metastatic castration resistant prostate cancer, with bone metastasis and path fracture of L4 as well as lymphadenopathy in the retroperitoneum and pelvis. On firstline treatment with enzalutamide as well as s/p palliative RT to L3-4 bon lesion 7/11.  PSA continues to be stably low in response to enzalutamide.  His PSA is generally low, likely because it's less differentiated vs neuroendocrine component.    Interim History: Michael [name] is doing very well on enzalutamide he has no complaints.     Adherence: He takes his Xtandi 3 capsules anywhere between 12 pm-3 pm. Sometimes he takes it late when wife is out. He also says he may have missed one dose. I recommended changing the time he takes his Xtandi to the morning with his other medications.     Toxicities: none    Drug-Drug Interactions: none    Medications:  Current Outpatient Medications   Medication Sig Dispense Refill   ??? acetaminophen (TYLENOL) 325 MG tablet Take 650 mg by mouth every morning.      ??? aspirin (ECOTRIN) 81 MG tablet Take 81 mg by mouth daily.     ??? CALCIUM CARBONATE/VITAMIN D3 (CALCIUM 600 WITH VITAMIN D3 ORAL) Take 1 tablet by mouth Two (2) times a day.      ??? chlorthalidone (HYGROTON) 25 MG tablet Take 0.5 tablets (12.5 mg total) by mouth daily. 45 tablet 3   ??? CHOLECALCIFEROL, VITAMIN D3, (VITAMIN D3 ORAL) Take 1 capsule by mouth once daily. Unsure of dose     ??? enzalutamide (XTANDI) 40 mg cap capsule Take 3 capsules (120 mg total) by mouth daily. 90 capsule 11   ??? gabapentin (NEURONTIN) 300 MG capsule Take 1 capsule (300 mg total) by mouth Two (2) times a day. 180 capsule 1   ??? levothyroxine (SYNTHROID, LEVOTHROID) 88 MCG tablet Take 1 tablet (88 mcg total) by mouth daily. 90 tablet 3   ??? lovastatin (MEVACOR) 40 MG tablet Take 1 tablet (40 mg total) by mouth daily with evening meal. 90 tablet 3   ??? vitamin B comp and C no.3 15-10-50-5-300 mg TbER Take 1 tablet by mouth once daily.        No current facility-administered medications for this visit.      I spent 10 minutes with Michael [name] in direct patient care.     Laverna Peace PharmD, BCOP, CPP  Hematology/Oncology Pharmacist  P: [phone]
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
## 2017-07-31 NOTE — Unmapped (Signed)
[facility] Refill Coordination Note  Specialty Medication(s): [name]  Additional Medications shipped: n/a    Michael [name], DOB: 08-10-1935  Phone: [phone] (home) , Alternate phone contact: N/A  Phone or address changes today?: No  All above HIPAA information was verified with patient.  Shipping Address: [address]  [facility] [address]   Insurance changes? No    Completed refill call assessment today to schedule patient's medication shipment from the [facility] [phone]).      Confirmed the medication and dosage are correct and have not changed: Yes, regimen is correct and unchanged.    Confirmed patient started or stopped the following medications in the past month:  No, there are no changes reported at this time.    Are you tolerating your medication?:  [name] reports tolerating the medication.    ADHERENCE    Is this medicine transplant or covered by Medicare Part B? No.        Did you miss any doses in the past 4 weeks? No missed doses reported.    FINANCIAL/SHIPPING    Delivery Scheduled: Yes, Expected medication delivery date: 08/08/17     The patient will receive an [facility]SI print out for each medication shipped and additional FDA Medication Guides as required.  Patient education from [place] or Robet Leu may also be included in the shipment.    [place] did not have any additional questions at this time.    Delivery address validated in FSI scheduling system: Yes, address listed in FSI is correct.    We will follow up with patient monthly for standard refill processing and delivery.      Thank you,  Azhar Yogi Vangie Bicker   [facility] Specialty Pharmacist

## 2017-08-07 MED FILL — XTANDI/40MG/CAPS: XTANDI/40MG/CAPS | 30 days supply | Qty: 90 | Fill #6

## 2017-09-05 ENCOUNTER — Encounter
Admit: 2017-09-05 | Discharge: 2017-09-06 | Payer: MEDICARE | Attending: Hematology & Oncology | Primary: Hematology & Oncology

## 2017-09-05 ENCOUNTER — Encounter: Admit: 2017-09-05 | Discharge: 2017-09-06 | Payer: MEDICARE

## 2017-09-05 DIAGNOSIS — C61 Malignant neoplasm of prostate: Principal | ICD-10-CM

## 2017-09-05 DIAGNOSIS — C7951 Secondary malignant neoplasm of bone: Secondary | ICD-10-CM

## 2017-09-05 LAB — CREATININE
CREATININE: 0.6 mg/dL — ABNORMAL LOW (ref 0.70–1.30)
EGFR CKD-EPI NON-AA MALE: >90 mL/min/{1.73_m2} (ref >=60)

## 2017-09-05 LAB — EGFR CKD-EPI NON-AA MALE: Lab: >90

## 2017-09-05 LAB — PHOSPHORUS: Phosphate:MCnc:Pt:Ser/Plas:Qn:: 3.5

## 2017-09-05 LAB — CALCIUM: Calcium:MCnc:Pt:Ser/Plas:Qn:: 9.2

## 2017-09-05 LAB — ALBUMIN: Albumin:MCnc:Pt:Ser/Plas:Qn:: 3.6

## 2017-09-05 LAB — PROSTATE SPECIFIC ANTIGEN: Prostate specific Ag:MCnc:Pt:Ser/Plas:Qn:: 0.72
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
## 2017-09-05 NOTE — Unmapped (Signed)
Lab Results   Component Value Date    PSA 0.65 07/16/2017    PSA 0.58 06/04/2017    PSA 0.57 04/16/2017    PSA 0.53 03/05/2017    PSA 0.50 01/22/2017    PSA 0.42 11/27/2016     Please call [phone] to reach my nurse navigator Mauricia Area for any issues.    For emergencies on Nights, Weekends and Holidays  Call [phone] and ask for the hematology/oncology on call.    Griffin Basil, MD, PhD  Associate Professor of Medicine  Division of Hematology-Oncology    [facility]  Genitourinary Oncology Clinic  Nurse Navigator: Mauricia Area  Fax: [phone]
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
## 2017-09-05 NOTE — Unmapped (Signed)
Venipuncture RAC, labs drawn and sent.  To MD appt.

## 2017-09-06 NOTE — Unmapped (Signed)
[facility] Refill Coordination Note    Specialty Medication(s) to be Shipped:   Hematology/Oncology: Michael [name] Running medication(s) to be shipped:       Michael [name], DOB: Feb 17, 1935  Phone: [phone] (home)   Shipping Address: [address]  [facility] [place] [address]    All above HIPAA information was verified with patient's family member.     Completed refill call assessment today to schedule patient's medication shipment from the [facility] [phone]).       Specialty medication(s) and dose(s) confirmed: Regimen is correct and unchanged.   Changes to medications: Michael [name] reports no changes reported at this time.  Changes to insurance: No  Questions for the pharmacist: No    The patient will receive a drug information handout for each medication shipped and additional FDA Medication Guides as required.      DISEASE/MEDICATION-SPECIFIC INFORMATION        N/A    ADHERENCE     Medication Adherence     Other adherence tool:  routine   Support network for adherence:  family member              MEDICARE PART B DOCUMENTATION         SHIPPING     Shipping address confirmed in Epic.     Delivery Scheduled: Yes, Expected medication delivery date: [id number] [facility] [place] via UPS or courier.     Antonietta Barcelona   [facility] Specialty Technician

## 2017-09-09 MED FILL — XTANDI 40 MG CAPSULE: 30 days supply | Qty: 90 | Fill #0 | Status: AC

## 2017-09-09 MED FILL — XTANDI 40 MG CAPSULE: 30 days supply | Qty: 90 | Fill #0

## 2017-10-02 NOTE — Unmapped (Signed)
[facility] Refill Coordination Note    Specialty Medication(s) to be Shipped: Xtandi      Other medication(s) to be shipped:      Michael [name], DOB: 1935/01/31  Phone: [phone] (home)   Shipping Address: [address]  [facility] [place] [address]    All above HIPAA information was verified with patient.     Completed refill call assessment today to schedule patient's medication shipment from the [facility] [phone]).       Specialty medication(s) and dose(s) confirmed: Regimen is correct and unchanged.   Changes to medications: Sadik reports no changes reported at this time.  Changes to insurance: No  Questions for the pharmacist: No    The patient will receive a drug information handout for each medication shipped and additional FDA Medication Guides as required.      DISEASE/MEDICATION-SPECIFIC INFORMATION        GU Oncology    ADHERENCE     Medication Adherence    Informant:  patient   Other adherence tool:  routine   Support network for adherence:  family member  Confirmed plan for next specialty medication refill:  delivery by pharmacy          Refill Coordination    Has the Patients' Contact Information Changed:  No  Is the Shipping Address Different:  No         MEDICARE PART B DOCUMENTATION     Xtandi: Patient has about a week on hand.    SHIPPING     Shipping address confirmed in Epic.     Delivery Scheduled: Yes, Expected medication delivery date: 10/10/2017 via UPS or courier.     Afifa Truax [employer]   [facility] Shared [facility] Specialty Technician

## 2017-10-09 MED FILL — XTANDI 40 MG CAPSULE: 30 days supply | Qty: 90 | Fill #1

## 2017-10-09 MED FILL — XTANDI 40 MG CAPSULE: 30 days supply | Qty: 90 | Fill #1 | Status: AC

## 2017-10-17 ENCOUNTER — Encounter: Admit: 2017-10-17 | Discharge: 2017-10-18 | Payer: MEDICARE

## 2017-10-17 ENCOUNTER — Encounter
Admit: 2017-10-17 | Discharge: 2017-10-18 | Payer: MEDICARE | Attending: Hematology & Oncology | Primary: Hematology & Oncology

## 2017-10-17 ENCOUNTER — Encounter: Admit: 2017-10-17 | Discharge: 2017-10-18 | Payer: MEDICARE | Attending: Children | Primary: Children

## 2017-10-17 DIAGNOSIS — C61 Malignant neoplasm of prostate: Principal | ICD-10-CM

## 2017-10-17 DIAGNOSIS — C7951 Secondary malignant neoplasm of bone: Secondary | ICD-10-CM

## 2017-10-17 DIAGNOSIS — C775 Secondary and unspecified malignant neoplasm of intrapelvic lymph nodes: Secondary | ICD-10-CM

## 2017-10-17 LAB — CALCIUM: Calcium:MCnc:Pt:Ser/Plas:Qn:: 9.3

## 2017-10-17 LAB — PROSTATE SPECIFIC ANTIGEN: Prostate specific Ag:MCnc:Pt:Ser/Plas:Qn:: 0.85

## 2017-10-17 LAB — CREATININE
CREATININE: 0.61 mg/dL — ABNORMAL LOW (ref 0.70–1.30)
EGFR CKD-EPI NON-AA MALE: >90 mL/min/{1.73_m2} (ref >=60)

## 2017-10-17 LAB — PHOSPHORUS: Phosphate:MCnc:Pt:Ser/Plas:Qn:: 3.1

## 2017-10-17 LAB — EGFR CKD-EPI AA MALE: Lab: >90

## 2017-10-17 LAB — ALBUMIN: Albumin:MCnc:Pt:Ser/Plas:Qn:: 3.9

## 2017-10-17 NOTE — Unmapped (Signed)
Lab Results   Component Value Date    PSA 0.85 10/17/2017    PSA 0.72 09/05/2017    PSA 0.65 07/16/2017    PSA 0.58 06/04/2017    PSA 0.57 04/16/2017    PSA 0.53 03/05/2017     Please call [phone] to reach my nurse navigator Mauricia Area for any issues.    For emergencies on Nights, Weekends and Holidays  Call [phone] and ask for the hematology/oncology on call.    Griffin Basil, MD, PhD  Associate Professor of Medicine  Division of Hematology-Oncology    [facility]  Genitourinary Oncology [facility]  Nurse Navigator: Mauricia Area  Fax: [phone]
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
## 2017-10-17 NOTE — Unmapped (Signed)
Clinical Pharmacist Practitioner: [facility]    Oral Hormonal Therapy Follow-Up    Assessment and recommendations:  1. Enzalutamide monitoring and side effect management- [name] is doing well with no side effects that he can note.   - Continue Enzalutamide 120 mg daily     2. Blood pressures- blood pressure slightly elevated in clinic today. He currently takes HCTZ 12.5 mg daily. He does not monitor his BPs at home.  - Monitor BP at home and bring log to next visit.    Follow- up: 6 months or sooner if needed    ______________________________________________________________________    Oral Hormonal Therapy: Enzalutamide 120 mg daily      Start date: 06/16/15    HPI: Metastatic castration resistant prostate cancer, with bone metastasis and path fracture of L4 as well as lymphadenopathy in the retroperitoneum and pelvis. On firstline treatment with enzalutamide as well as s/p palliative RT to L3-4 bon lesion 7/11.  PSA continues to be stably low in response to enzalutamide.  His PSA is generally low, likely because it's less differentiated vs neuroendocrine component.    Interim History: [name] is doing very well on enzalutamide he has no complaints. He changed the way he is taking his medication to take it in the morning with his other medications and he says this is more convenient for him to take it this way.     Adherence: He takes his Xtandi 3 capsules in the morning. No missed doses.     Toxicities: none    Drug-Drug Interactions: none    Medications:  Current Outpatient Medications   Medication Sig Dispense Refill   ??? acetaminophen (TYLENOL) 325 MG tablet Take 650 mg by mouth every morning.      ??? aspirin (ECOTRIN) 81 MG tablet Take 81 mg by mouth daily.     ??? CALCIUM CARBONATE/VITAMIN D3 (CALCIUM 600 WITH VITAMIN D3 ORAL) Take 1 tablet by mouth Two (2) times a day.      ??? chlorthalidone (HYGROTON) 25 MG tablet Take 0.5 tablets (12.5 mg total) by mouth daily. 45 tablet 3   ??? CHOLECALCIFEROL, VITAMIN D3, (VITAMIN D3 ORAL) Take 1 capsule by mouth once daily. Unsure of dose     ??? enzalutamide (XTANDI) 40 mg capsule TAKE 3 CAPSULES (120MG ) BY MOUTH ONCE DAILY 90 each 11   ??? gabapentin (NEURONTIN) 300 MG capsule Take 1 capsule (300 mg total) by mouth Two (2) times a day. 180 capsule 1   ??? levothyroxine (SYNTHROID, LEVOTHROID) 88 MCG tablet Take 1 tablet (88 mcg total) by mouth daily. 90 tablet 3   ??? lovastatin (MEVACOR) 40 MG tablet Take 1 tablet (40 mg total) by mouth daily with evening meal. 90 tablet 3   ??? vitamin B comp and C no.3 15-10-50-5-300 mg TbER Take 1 tablet by mouth once daily.        No current facility-administered medications for this visit.      I spent 10 minutes with Michael [name] in direct patient care.     Laverna Peace PharmD, BCOP, CPP  Hematology/Oncology Pharmacist  P: [phone]

## 2017-10-17 NOTE — Unmapped (Signed)
Venipuncture RAC, labs drawn and sent.  To next appt.

## 2017-10-20 LAB — TESTOSTERONE FREE
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
## 2017-11-01 NOTE — Unmapped (Signed)
[facility] Refill Coordination Note  Specialty Medication(s): Xtandi 40mg   Additional Medications shipped: none    Michael [name], DOB: 10-21-1935  Phone: [phone] (home) , Alternate phone contact: N/A  Phone or address changes today?: No  All above HIPAA information was verified with patient.  Shipping Address: [address]  [facility]  [address]   Insurance changes? No    Completed refill call assessment today to schedule patient's medication shipment from the [facility] [phone]).      Confirmed the medication and dosage are correct and have not changed: Yes, regimen is correct and unchanged.    Confirmed patient started or stopped the following medications in the past month:  No, there are no changes reported at this time.    Are you tolerating your medication?:  Michael [name] reports tolerating the medication.    ADHERENCE    (Below is required for Medicare Part B or Transplant patients only - per drug):   How many tablets were dispensed last month: 90 capsules  Patient currently has 21 capsules remaining.    Did you miss any doses in the past 4 weeks? No missed doses reported.    FINANCIAL/SHIPPING    Delivery Scheduled: Yes, Expected medication delivery date: 11/06/17      Medication will be delivered via Next Day Courier to the home address in [facility].    The patient will receive a drug information handout for each medication shipped and additional FDA Medication Guides as required.      Michael [name] did not have any additional questions at this time.    We will follow up with patient monthly for standard refill processing and delivery.      Thank you,  Roderic Palau   [facility] Specialty Pharmacist

## 2017-11-01 NOTE — Unmapped (Signed)
PHONE CALL  PATIENT: Michael [name]  GENETIC COUNSELOR: Zellie Jenning, MS, CGC    REASON FOR CALL: Normal Genetic Testing Result Disclosure    Called the patient, Michael [name] to discuss his recent genetic test results. We had recommended genetic testing based on his personal history of prostate cancer.    RESULTS. Normal result. Testing did not identify any genetic changes associated with cancer predisposition. Michael [name]'s test was performed at [facility] and included sequencing and deletion/duplication analysis of the following genes: ATM, ATR, BRCA1, BRCA2, BRIP1, CHEK2, EPCAM (del/dup only), FANCA, GEN1, HOXB13 (sequencing only), MLH1, MSH2, MSH6, NBN, PALB2, PMS2, RAD51C, RAD51D, TP53.     We consider his normal result to be reassuring and do not recommend additional genetic testing at this time.     A copy of the test result, family history and summary letter will be mailed to the patient.     We encouraged the patient to please remain in contact with [place] to update [place] with relevant personal and family history information that could impact our assessment.     Michael Dunk Abdoul Encinas, MS, CGC  _______________________________________  [facility]  [phone]  [email or web address]

## 2017-11-05 MED FILL — XTANDI 40 MG CAPSULE: 30 days supply | Qty: 90 | Fill #2

## 2017-11-05 MED FILL — XTANDI 40 MG CAPSULE: 30 days supply | Qty: 90 | Fill #2 | Status: AC

## 2017-11-06 ENCOUNTER — Encounter: Admit: 2017-11-06 | Discharge: 2017-11-07 | Payer: MEDICARE | Attending: Family | Primary: Family

## 2017-11-06 DIAGNOSIS — I10 Essential (primary) hypertension: Principal | ICD-10-CM

## 2017-11-06 DIAGNOSIS — E034 Atrophy of thyroid (acquired): Secondary | ICD-10-CM

## 2017-11-06 DIAGNOSIS — E785 Hyperlipidemia, unspecified: Secondary | ICD-10-CM

## 2017-11-06 MED ORDER — GABAPENTIN 300 MG CAPSULE
ORAL_CAPSULE | Freq: Two times a day (BID) | ORAL | 3 refills | 0 days | Status: CP
Start: 2017-11-06 — End: 2018-02-20
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
## 2017-11-29 NOTE — Unmapped (Addendum)
[name] Refill Coordination Note    Specialty Medication(s) to be Shipped:   Hematology/Oncology: [name], DOB: 09-Sep-1935  Phone: [phone] (home)       All above HIPAA information was verified with patient.     Completed refill call assessment today to schedule patient's medication shipment from the [facility] [phone]).       Specialty medication(s) and dose(s) confirmed: Regimen is correct and unchanged.   Changes to medications: [name] reports no changes reported at this time.  Changes to insurance: No  Questions for the pharmacist: No    The patient will receive a drug information handout for each medication shipped and additional FDA Medication Guides as required.      DISEASE/MEDICATION-SPECIFIC INFORMATION        N/A    ADHERENCE     Medication Adherence    Patient reported X missed doses in the last month:  0  Specialty Medication:  XTANDI 40 mg  Patient is on additional specialty medications:  No  Patient is on more than two specialty medications:  No  Any gaps in refill history greater than 2 weeks in the last 3 months:  no  Demonstrates understanding of importance of adherence:  yes  Informant:  patient  Reliability of informant:  reliable  Provider-estimated medication adherence level:  good      Adherence tools used:  medication list   Other adherence tool:  routine   Support network for adherence:  family member      Confirmed plan for next specialty medication refill:  delivery by pharmacy          Refill Coordination    Has the Patients' Contact Information Changed:  No  Is the Shipping Address Different:  No         MEDICARE PART B DOCUMENTATION     XTANDI 40 mg : Patient has 5 days worth on hand.    SHIPPING     Shipping address confirmed in Epic.     Delivery Scheduled: Yes, Expected medication delivery date: XTANDI 40 mg  via UPS or courier.  Delivery 12/05/17 via UPS.    Medication will be delivered via UPS to the home address in Epic WAM.    [name]   [name] Specialty Technician

## 2017-12-03 ENCOUNTER — Encounter: Admit: 2017-12-03 | Discharge: 2017-12-16 | Payer: MEDICARE

## 2017-12-03 DIAGNOSIS — C61 Malignant neoplasm of prostate: Principal | ICD-10-CM

## 2017-12-03 DIAGNOSIS — C7951 Secondary malignant neoplasm of bone: Secondary | ICD-10-CM

## 2017-12-04 MED FILL — XTANDI 40 MG CAPSULE: 30 days supply | Qty: 90 | Fill #3

## 2017-12-04 MED FILL — XTANDI 40 MG CAPSULE: 30 days supply | Qty: 90 | Fill #3 | Status: AC

## 2017-12-17 ENCOUNTER — Ambulatory Visit
Admit: 2017-12-17 | Discharge: 2017-12-18 | Payer: MEDICARE | Attending: Hematology & Oncology | Primary: Hematology & Oncology

## 2017-12-17 ENCOUNTER — Encounter: Admit: 2017-12-17 | Discharge: 2017-12-18 | Payer: MEDICARE

## 2017-12-17 DIAGNOSIS — R978 Other abnormal tumor markers: Secondary | ICD-10-CM

## 2017-12-17 DIAGNOSIS — C7B8 Other secondary neuroendocrine tumors: Secondary | ICD-10-CM

## 2017-12-17 DIAGNOSIS — C7951 Secondary malignant neoplasm of bone: Secondary | ICD-10-CM

## 2017-12-17 DIAGNOSIS — C61 Malignant neoplasm of prostate: Secondary | ICD-10-CM

## 2017-12-17 DIAGNOSIS — C775 Secondary and unspecified malignant neoplasm of intrapelvic lymph nodes: Secondary | ICD-10-CM

## 2017-12-17 LAB — COMPREHENSIVE METABOLIC PANEL
ALBUMIN: 4 g/dL (ref 3.5–5.0)
ALKALINE PHOSPHATASE: 42 U/L (ref 38–126)
ALT (SGPT): 14 U/L (ref <50)
ANION GAP: 6 mmol/L — ABNORMAL LOW (ref 7–15)
AST (SGOT): 20 U/L (ref 19–55)
BILIRUBIN TOTAL: 0.5 mg/dL (ref 0.0–1.2)
BLOOD UREA NITROGEN: 21 mg/dL (ref 7–21)
BUN / CREAT RATIO: 29
CALCIUM: 9.5 mg/dL (ref 8.5–10.2)
CHLORIDE: 101 mmol/L (ref 98–107)
CO2: 33 mmol/L — ABNORMAL HIGH (ref 22.0–30.0)
CREATININE: 0.72 mg/dL (ref 0.70–1.30)
EGFR CKD-EPI AA MALE: >90 mL/min/{1.73_m2} (ref >=60)
EGFR CKD-EPI NON-AA MALE: 88 mL/min/{1.73_m2} (ref >=60)
POTASSIUM: 4.7 mmol/L (ref 3.5–5.0)
PROTEIN TOTAL: 6.8 g/dL (ref 6.5–8.3)
SODIUM: 140 mmol/L (ref 135–145)

## 2017-12-17 LAB — CBC W/ AUTO DIFF
BASOPHILS ABSOLUTE COUNT: 0 10*9/L (ref 0.0–0.1)
BASOPHILS RELATIVE PERCENT: 0.6 %
EOSINOPHILS ABSOLUTE COUNT: 0.1 10*9/L (ref 0.0–0.4)
EOSINOPHILS RELATIVE PERCENT: 4.1 %
HEMATOCRIT: 41.3 % (ref 41.0–53.0)
HEMOGLOBIN: 14 g/dL (ref 13.5–17.5)
LARGE UNSTAINED CELLS: 3 % (ref 0–4)
LYMPHOCYTES RELATIVE PERCENT: 20.7 %
MEAN CORPUSCULAR HEMOGLOBIN CONC: 33.9 g/dL (ref 31.0–37.0)
MEAN CORPUSCULAR HEMOGLOBIN: 32.7 pg (ref 26.0–34.0)
MEAN CORPUSCULAR VOLUME: 96.7 fL (ref 80.0–100.0)
MEAN PLATELET VOLUME: 7.7 fL (ref 7.0–10.0)
MONOCYTES ABSOLUTE COUNT: 0.3 10*9/L (ref 0.2–0.8)
MONOCYTES RELATIVE PERCENT: 7.6 %
NEUTROPHILS ABSOLUTE COUNT: 2.1 10*9/L (ref 2.0–7.5)
NEUTROPHILS RELATIVE PERCENT: 64 %
RED BLOOD CELL COUNT: 4.28 10*12/L — ABNORMAL LOW (ref 4.50–5.90)
RED CELL DISTRIBUTION WIDTH: 14.7 % (ref 12.0–15.0)
WBC ADJUSTED: 3.2 10*9/L — ABNORMAL LOW (ref 4.5–11.0)

## 2017-12-17 LAB — LACTATE DEHYDROGENASE
LACTATE DEHYDROGENASE: 424 U/L (ref 338–610)
Lactate dehydrogenase:CCnc:Pt:Ser/Plas:Qn:: 424

## 2017-12-17 LAB — LYMPHOCYTES RELATIVE PERCENT: Lab: 20.7

## 2017-12-17 LAB — PROTEIN TOTAL: Protein:MCnc:Pt:Ser/Plas:Qn:: 6.8

## 2017-12-17 LAB — PHOSPHORUS: Phosphate:MCnc:Pt:Ser/Plas:Qn:: 3.4

## 2017-12-17 LAB — CARCINOEMBRYONIC ANTIGEN: Carcinoembryonic Ag:MCnc:Pt:Ser/Plas:Qn:: 1.6

## 2017-12-17 LAB — PROSTATE SPECIFIC ANTIGEN: Prostate specific Ag:MCnc:Pt:Ser/Plas:Qn:: 1.02

## 2017-12-17 NOTE — Unmapped (Addendum)
Lab Results   Component Value Date    PSA 1.02 12/17/2017    PSA 0.85 10/17/2017    PSA 0.72 09/05/2017    PSA 0.65 07/16/2017    PSA 0.58 06/04/2017    PSA 0.57 04/16/2017   We discussed the fact that the bone scan shows a few more new spots of cancer in the bone.  My recommendations are:  1) Continue with Xtandi (enzalutamide) as you have been doing.  2) Start treatment with infusion of Radium 223 [name]) once a month for six treatments, to start in Jan 2020.    Please call [phone] to reach my nurse navigator Mauricia Area for any issues.    For emergencies on Nights, Weekends and Holidays  Call [phone] and ask for the hematology/oncology on call.    Griffin Basil, MD, PhD  Associate Professor of Medicine  Division of Hematology-Oncology    [facility]  Genitourinary Oncology Clinic  Nurse Navigator: Mauricia Area  Fax: [phone]
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
## 2017-12-17 NOTE — Unmapped (Signed)
Labs drawn and sent for analysis.  Care provided by  T Fallecker, RN

## 2017-12-18 NOTE — Unmapped (Signed)
Spoke with pt and let him know that we need some paperwork signed for [employer]. Explained to pt what they are. Faxed to PCP per pt request and pt will call back when faxed back. Callback number provided.

## 2017-12-19 LAB — CHROMOGRANIN A: Chromogranin A:MCnc:Pt:Ser/Plas:Qn:: 87

## 2017-12-21 NOTE — Unmapped (Signed)
Phone # below is incorrect: [name] # is [phone]    Returned the call. Their rep was just checking on the status of the paperwork; informed them that it is in process on our end. Per chart review, [employer] Jobie Quaker is awaiting pateint signature.     PLAN:  We will contact [employer] again as needed.     Lucia Gaskins RN  Per diem navigator

## 2017-12-21 NOTE — Unmapped (Signed)
Lina Sayre with [name] contacted the Communication Center requesting to speak with the care team of Michael [name] to discuss:    The patient benefits and financial assistance with the [employer].    Please contact at [phone].    Program: GU  Speciality: Medical Oncology    Check Indicates criteria has been reviewed and confirmed with the patient:    []  Preferred Name   [x]  DOB and/or MR#  [x]  Preferred Contact Method  [x]  Phone Number(s)   []  MyChart     Thank you,   Vernie Ammons  [facility]   [phone]

## 2017-12-24 NOTE — Unmapped (Signed)
Called pt to let him know benefit verification fron [name] is back. No answer so left VM with callback number.

## 2017-12-25 NOTE — Unmapped (Signed)
Spoke with pt and let him know that we received [employer] benefit verification and also approved for financial assistance through [employer]. Confirmed all future appts for 1/7 including radium.

## 2017-12-26 NOTE — Unmapped (Signed)
[facility] Refill Coordination Note    Specialty Medication(s) to be Shipped:   Hematology/Oncology: Michael [name], DOB: [birth year]  Phone: [phone] (home)       All above HIPAA information was verified with patient.     Completed refill call assessment today to schedule patient's medication shipment from the [facility] [phone]).       Specialty medication(s) and dose(s) confirmed: Regimen is correct and unchanged.   Changes to medications: Michael [name] reports no changes reported at this time.  Changes to insurance: No  Questions for the pharmacist: No    The patient will receive a drug information handout for each medication shipped and additional FDA Medication Guides as required.      DISEASE/MEDICATION-SPECIFIC INFORMATION        N/A    ADHERENCE     Medication Adherence    Patient reported X missed doses in the last month:  0  Specialty Medication:  Xtandi 40mg   Patient is on additional specialty medications:  No  Patient is on more than two specialty medications:  No  Any gaps in refill history greater than 2 weeks in the last 3 months:  no  Demonstrates understanding of importance of adherence:  yes  Informant:  patient  Provider-estimated medication adherence level:  good      Adherence tools used:  medication list   Other adherence tool:  routine   Support network for adherence:  family member      Confirmed plan for next specialty medication refill:  delivery by pharmacy  Refills needed for supportive medications:  yes, ordered or provider notified          Refill Coordination    Has the Patients' Contact Information Changed:  No  Is the Shipping Address Different:  No         MEDICARE PART B DOCUMENTATION     Xtandi: Patient has 10 days worth on hand.    SHIPPING     Shipping address confirmed in Epic.     Delivery Scheduled: Yes, Expected medication delivery date: 01/02/2018 via UPS or courier.     Medication will be delivered via Next Day Courier to the home address in Epic [place].    [name]   [facility] Specialty Technician

## 2017-12-26 NOTE — Unmapped (Signed)
Please refill if appropriate Enzalutamide 40 mg take 3 capsules once daily

## 2017-12-27 MED ORDER — ENZALUTAMIDE 40 MG CAPSULE
11 refills | 0 days | Status: CP
Start: 2017-12-27 — End: 2018-12-27
  Filled 2018-01-01: qty 90, 30d supply, fill #0

## 2018-01-01 MED FILL — XTANDI 40 MG CAPSULE: 30 days supply | Qty: 90 | Fill #0 | Status: AC

## 2018-01-21 ENCOUNTER — Encounter: Admit: 2018-01-21 | Discharge: 2018-01-27 | Payer: MEDICARE

## 2018-01-21 ENCOUNTER — Encounter
Admit: 2018-01-21 | Discharge: 2018-01-27 | Payer: MEDICARE | Attending: Hematology & Oncology | Primary: Hematology & Oncology

## 2018-01-21 DIAGNOSIS — C61 Malignant neoplasm of prostate: Secondary | ICD-10-CM

## 2018-01-21 DIAGNOSIS — C7951 Secondary malignant neoplasm of bone: Secondary | ICD-10-CM

## 2018-01-21 DIAGNOSIS — E034 Atrophy of thyroid (acquired): Secondary | ICD-10-CM

## 2018-01-21 DIAGNOSIS — C775 Secondary and unspecified malignant neoplasm of intrapelvic lymph nodes: Secondary | ICD-10-CM

## 2018-01-21 LAB — CBC W/ AUTO DIFF
BASOPHILS ABSOLUTE COUNT: 0 10*9/L (ref 0.0–0.1)
BASOPHILS RELATIVE PERCENT: 0.6 %
EOSINOPHILS ABSOLUTE COUNT: 0.1 10*9/L (ref 0.0–0.4)
EOSINOPHILS RELATIVE PERCENT: 1.9 %
HEMATOCRIT: 36.5 % — ABNORMAL LOW (ref 41.0–53.0)
HEMOGLOBIN: 12.4 g/dL — ABNORMAL LOW (ref 13.5–17.5)
LARGE UNSTAINED CELLS: 1 % (ref 0–4)
LYMPHOCYTES RELATIVE PERCENT: 21 %
MEAN CORPUSCULAR HEMOGLOBIN CONC: 34 g/dL (ref 31.0–37.0)
MEAN CORPUSCULAR HEMOGLOBIN: 33.3 pg (ref 26.0–34.0)
MEAN CORPUSCULAR VOLUME: 97.8 fL (ref 80.0–100.0)
MEAN PLATELET VOLUME: 7.7 fL (ref 7.0–10.0)
MONOCYTES ABSOLUTE COUNT: 0.2 10*9/L (ref 0.2–0.8)
MONOCYTES RELATIVE PERCENT: 6.5 %
NEUTROPHILS ABSOLUTE COUNT: 2.6 10*9/L (ref 2.0–7.5)
NEUTROPHILS RELATIVE PERCENT: 68.6 %
PLATELET COUNT: 196 10*9/L (ref 150–440)
RED CELL DISTRIBUTION WIDTH: 14.7 % (ref 12.0–15.0)
WBC ADJUSTED: 3.7 10*9/L — ABNORMAL LOW (ref 4.5–11.0)

## 2018-01-21 LAB — THYROID STIMULATING HORMONE: Thyrotropin:ACnc:Pt:Ser/Plas:Qn:: 5.062 — ABNORMAL HIGH

## 2018-01-21 LAB — ALBUMIN: Albumin:MCnc:Pt:Ser/Plas:Qn:: 3.7

## 2018-01-21 LAB — PROSTATE SPECIFIC ANTIGEN: Prostate specific Ag:MCnc:Pt:Ser/Plas:Qn:: 1.13

## 2018-01-21 LAB — CREATININE: Creatinine:MCnc:Pt:Ser/Plas:Qn:: 0.6 — ABNORMAL LOW

## 2018-01-21 LAB — PHOSPHORUS: Phosphate:MCnc:Pt:Ser/Plas:Qn:: 3.7

## 2018-01-21 LAB — LYMPHOCYTES RELATIVE PERCENT: Lab: 21

## 2018-01-21 LAB — CALCIUM: Calcium:MCnc:Pt:Ser/Plas:Qn:: 9.3

## 2018-01-21 MED ORDER — LEVOTHYROXINE 100 MCG TABLET
ORAL_TABLET | Freq: Every day | ORAL | 1 refills | 0.00000 days | Status: CP
Start: 2018-01-21 — End: 2018-07-11

## 2018-01-21 NOTE — Unmapped (Signed)
Addended by: Noralyn Pick on: 01/21/2018 02:23 PM     Modules accepted: Orders
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
## 2018-01-21 NOTE — Unmapped (Addendum)
Lab Results   Component Value Date    PSA 1.02 12/17/2017    PSA 0.85 10/17/2017    PSA 0.72 09/05/2017    PSA 0.65 07/16/2017    PSA 0.58 06/04/2017    PSA 0.57 04/16/2017   You will be getting your first infusion of Radium 223 today.    Please call [phone] to reach my nurse navigator Mauricia Area for any issues.    For emergencies on Nights, Weekends and Holidays  Call [phone] and ask for the hematology/oncology on call.    Griffin Basil, MD, PhD  Associate Professor of Medicine  Division of Hematology-Oncology    [facility]  Genitourinary Oncology Clinic  Nurse Navigator: Mauricia Area  Fax: [phone]

## 2018-01-21 NOTE — Unmapped (Signed)
Patient labs drawn and sent for analysis. Care per Will Bonnet

## 2018-01-22 NOTE — Unmapped (Signed)
Spoke with pt and he is in agreement to move up his appointment to 9:30am on 2/6 with labs at 8:30am. Will get his radium scheduled after provider visit. Message will be sent to scheduler.

## 2018-01-22 NOTE — Unmapped (Signed)
Pt changed.    Thanks,  [employer]

## 2018-01-29 NOTE — Unmapped (Signed)
[facility] Refill Coordination Note    Specialty Medication(s) to be Shipped:   Hematology/Oncology: [name] 40mg        [name], DOB: [birth year]  Phone: [phone] (home)       All above HIPAA information was verified with patient.     Completed refill call assessment today to schedule patient's medication shipment from the [facility] [phone]).       Specialty medication(s) and dose(s) confirmed: Regimen is correct and unchanged.   Changes to medications: Suren reports no changes reported at this time.  Changes to insurance: No  Questions for the pharmacist: No    The patient will receive a drug information handout for each medication shipped and additional FDA Medication Guides as required.      DISEASE/MEDICATION-SPECIFIC INFORMATION        N/A    ADHERENCE     Medication Adherence    Patient reported X missed doses in the last month:  0  Specialty Medication:  XTANDI 40 mg   Patient is on additional specialty medications:  No  Patient is on more than two specialty medications:  No  Any gaps in refill history greater than 2 weeks in the last 3 months:  no  Demonstrates understanding of importance of adherence:  yes  Informant:  patient  Reliability of informant:  reliable  Provider-estimated medication adherence level:  good      Adherence tools used:  medication list   Other adherence tool:  routine   Support network for adherence:  family member      Confirmed plan for next specialty medication refill:  delivery by pharmacy          Refill Coordination    Has the Patients' Contact Information Changed:  No  Is the Shipping Address Different:  No         MEDICARE PART B DOCUMENTATION     XTANDI 40 mg: Patient has 3 days worth on hand.    SHIPPING     Shipping address confirmed in Epic.     Delivery Scheduled: Yes, Expected medication delivery date: 01/30/2018 via UPS or courier.     Medication will be delivered via Same Day Courier to the home address in Epic [place]. [name]   [facility] Specialty Technician

## 2018-01-30 MED FILL — XTANDI 40 MG CAPSULE: 30 days supply | Qty: 90 | Fill #1

## 2018-01-30 MED FILL — XTANDI 40 MG CAPSULE: 30 days supply | Qty: 90 | Fill #1 | Status: AC

## 2018-02-20 ENCOUNTER — Encounter: Admit: 2018-02-20 | Discharge: 2018-02-24 | Payer: MEDICARE

## 2018-02-20 ENCOUNTER — Encounter
Admit: 2018-02-20 | Discharge: 2018-02-24 | Payer: MEDICARE | Attending: Hematology & Oncology | Primary: Hematology & Oncology

## 2018-02-20 DIAGNOSIS — C61 Malignant neoplasm of prostate: Principal | ICD-10-CM

## 2018-02-20 DIAGNOSIS — C775 Secondary and unspecified malignant neoplasm of intrapelvic lymph nodes: Secondary | ICD-10-CM

## 2018-02-20 DIAGNOSIS — C7951 Secondary malignant neoplasm of bone: Secondary | ICD-10-CM

## 2018-02-20 LAB — CBC W/ AUTO DIFF
BASOPHILS ABSOLUTE COUNT: 0 10*9/L (ref 0.0–0.1)
EOSINOPHILS ABSOLUTE COUNT: 0.1 10*9/L (ref 0.0–0.4)
EOSINOPHILS RELATIVE PERCENT: 3 %
HEMATOCRIT: 37.7 % — ABNORMAL LOW (ref 41.0–53.0)
HEMOGLOBIN: 12.5 g/dL — ABNORMAL LOW (ref 13.5–17.5)
LARGE UNSTAINED CELLS: 1 % (ref 0–4)
LYMPHOCYTES RELATIVE PERCENT: 17.8 %
MEAN CORPUSCULAR HEMOGLOBIN CONC: 33.2 g/dL (ref 31.0–37.0)
MEAN CORPUSCULAR HEMOGLOBIN: 32.4 pg (ref 26.0–34.0)
MEAN CORPUSCULAR VOLUME: 97.6 fL (ref 80.0–100.0)
MEAN PLATELET VOLUME: 8.1 fL (ref 7.0–10.0)
MONOCYTES ABSOLUTE COUNT: 0.2 10*9/L (ref 0.2–0.8)
MONOCYTES RELATIVE PERCENT: 7.3 %
NEUTROPHILS RELATIVE PERCENT: 70 %
PLATELET COUNT: 193 10*9/L (ref 150–440)
RED BLOOD CELL COUNT: 3.86 10*12/L — ABNORMAL LOW (ref 4.50–5.90)
RED CELL DISTRIBUTION WIDTH: 14.5 % (ref 12.0–15.0)
WBC ADJUSTED: 3.1 10*9/L — ABNORMAL LOW (ref 4.5–11.0)

## 2018-02-20 LAB — PROSTATE SPECIFIC ANTIGEN: Prostate specific Ag:MCnc:Pt:Ser/Plas:Qn:: 1.13

## 2018-02-20 LAB — CALCIUM: Calcium:MCnc:Pt:Ser/Plas:Qn:: 9.3

## 2018-02-20 LAB — BASOPHILS RELATIVE PERCENT: Lab: 0.5

## 2018-02-20 LAB — CREATININE
Creatinine:MCnc:Pt:Ser/Plas:Qn:: 0.61 — ABNORMAL LOW
EGFR CKD-EPI AA MALE: >90 mL/min/{1.73_m2} (ref >=60)

## 2018-02-20 LAB — ALBUMIN: Albumin:MCnc:Pt:Ser/Plas:Qn:: 3.7

## 2018-02-20 LAB — PHOSPHORUS: Phosphate:MCnc:Pt:Ser/Plas:Qn:: 3.7
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
## 2018-02-20 NOTE — Unmapped (Signed)
Labs drawn and sent for analysis.  Care provided by  J Carey, RN

## 2018-02-20 NOTE — Unmapped (Signed)
Lab Results   Component Value Date    PSA 1.13 02/20/2018    PSA 1.13 01/21/2018    PSA 1.02 12/17/2017    PSA 0.85 10/17/2017    PSA 0.72 09/05/2017    PSA 0.65 07/16/2017     Please call [phone] to reach my nurse navigator Laury Deep for any issues.    For emergencies on Nights, Weekends and Holidays  Call [phone] and ask for the hematology/oncology on call.    Griffin Basil, MD, PhD  Associate Professor of Medicine  Division of Hematology-Oncology    [facility]  Genitourinary Oncology Clinic  Nurse Navigator: Laury Deep  Fax: [phone]

## 2018-02-21 NOTE — Unmapped (Signed)
See telephone note from 02/21/18

## 2018-02-21 NOTE — Unmapped (Signed)
Clinical Pharmacist Practitioner: [facility]    Oral Hormonal Therapy Follow-Up    Assessment and recommendations:  1. Enzalutamide monitoring and side effect management- [name] is doing well with no side effects on enzalutamide that he can note. He also is on Radium 223 for progression in his bone mets with stable lymph node disease.   - Continue Enzalutamide 120 mg daily     Follow- up: 3/10 with Dr. Philomena Course for cycle #3 of radium-223    ______________________________________________________________________    Oral Hormonal Therapy: Enzalutamide 120 mg daily      Start date: 06/16/15    HPI: Metastatic castration resistant prostate cancer, with bone metastasis and path fracture of L4 as well as lymphadenopathy in the retroperitoneum and pelvis. On firstline treatment with enzalutamide as well as s/p palliative RT to L3-4 bon lesion 7/11.  PSA continues to be stably low in response to enzalutamide.  His PSA is generally low, likely because it's less differentiated vs neuroendocrine component. He is now on Radium 223 for bone progression with stable lymph node disease.     Interim History: Michael [name] is doing very well on enzalutamide he has no complaints. He denies any fatigue, diarrhea, seizures, or any other adverse effects. He takes his medications in the mornings without any missed doses in the last 3 months.      Adherence: He takes his Xtandi 3 capsules in the morning.    Toxicities: none    Drug-Drug Interactions: none    Medications:  Current Outpatient Medications   Medication Sig Dispense Refill   ??? acetaminophen (TYLENOL) 325 MG tablet Take 650 mg by mouth two (2) times a day.      ??? aspirin (ECOTRIN) 81 MG tablet Take 81 mg by mouth daily.     ??? CALCIUM CARBONATE/VITAMIN D3 (CALCIUM 600 WITH VITAMIN D3 ORAL) Take 1 tablet by mouth Two (2) times a day.      ??? chlorthalidone (HYGROTON) 25 MG tablet Take 0.5 tablets (12.5 mg total) by mouth daily. 45 tablet 3   ??? CHOLECALCIFEROL, VITAMIN D3, (VITAMIN D3 ORAL) Take 1 capsule by mouth once daily. Unsure of dose     ??? enzalutamide (XTANDI) 40 mg capsule TAKE 3 CAPSULES (120MG ) BY MOUTH ONCE DAILY 90 each 11   ??? gabapentin (NEURONTIN) 300 MG capsule Take 300 mg by mouth nightly.     ??? levothyroxine (SYNTHROID) 100 MCG tablet Take 1 tablet (100 mcg total) by mouth daily. 90 tablet 1   ??? lovastatin (MEVACOR) 40 MG tablet Take 1 tablet (40 mg total) by mouth daily with evening meal. 90 tablet 3   ??? vitamin B comp and C no.3 15-10-50-5-300 mg TbER Take 1 tablet by mouth once daily.        No current facility-administered medications for this visit.      I spent 10 minutes with MichaelFilippone in direct patient care.     Nicholos Johns, PharmD   PGY-2 Oncology Pharmacy Resident  Pager: [phone]

## 2018-02-21 NOTE — Unmapped (Signed)
[facility] Refill Coordination Note    Specialty Medication(s) to be Shipped:   Hematology/Oncology: Michael [name] 40mg        Michael [name], DOB: [birth year]  Phone: [phone] (home)       All above HIPAA information was verified with patient.     Completed refill call assessment today to schedule patient's medication shipment from the [facility] [phone]).       Specialty medication(s) and dose(s) confirmed: Regimen is correct and unchanged.   Changes to medications: Michael [name] reports no changes reported at this time.  Changes to insurance: No  Questions for the pharmacist: No    The patient will receive a drug information handout for each medication shipped and additional FDA Medication Guides as required.      DISEASE/MEDICATION-SPECIFIC INFORMATION        N/A    ADHERENCE     Medication Adherence    Patient reported X missed doses in the last month:  0  Specialty Medication:  Xtandi 40mg   Patient is on additional specialty medications:  No  Patient is on more than two specialty medications:  No  Any gaps in refill history greater than 2 weeks in the last 3 months:  no  Demonstrates understanding of importance of adherence:  yes  Informant:  patient  Reliability of informant:  reliable  Provider-estimated medication adherence level:  good  Adherence tools used:  medication list   Other adherence tool:  routine   Support network for adherence:  family member  Confirmed plan for next specialty medication refill:  delivery by pharmacy          Refill Coordination    Has the Patients' Contact Information Changed:  No  Is the Shipping Address Different:  No         MEDICARE PART B DOCUMENTATION     Xtandi 40mg : Patient has 9 days worth on hand.    SHIPPING     Shipping address confirmed in Epic.     Delivery Scheduled: Yes, Expected medication delivery date: 02/28/2018 via UPS or courier.     Medication will be delivered via Next Day Courier to the home address in [place].    Michael [name]   [facility] Specialty Technician

## 2018-02-27 MED FILL — XTANDI 40 MG CAPSULE: 30 days supply | Qty: 90 | Fill #2

## 2018-02-27 MED FILL — XTANDI 40 MG CAPSULE: 30 days supply | Qty: 90 | Fill #2 | Status: AC

## 2018-03-14 ENCOUNTER — Encounter: Admit: 2018-03-14 | Discharge: 2018-03-15 | Payer: MEDICARE | Attending: Family | Primary: Family

## 2018-03-14 DIAGNOSIS — E785 Hyperlipidemia, unspecified: Secondary | ICD-10-CM

## 2018-03-14 DIAGNOSIS — I10 Essential (primary) hypertension: Principal | ICD-10-CM

## 2018-03-14 DIAGNOSIS — E034 Atrophy of thyroid (acquired): Secondary | ICD-10-CM

## 2018-03-14 DIAGNOSIS — R0981 Nasal congestion: Secondary | ICD-10-CM

## 2018-03-14 MED ORDER — CHLORTHALIDONE 25 MG TABLET
ORAL_TABLET | Freq: Every day | ORAL | 3 refills | 0.00000 days | Status: CP
Start: 2018-03-14 — End: ?

## 2018-03-14 MED ORDER — CETIRIZINE 10 MG TABLET
ORAL_TABLET | Freq: Every day | ORAL | 1 refills | 0 days | Status: CP
Start: 2018-03-14 — End: 2019-03-14
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
## 2018-03-20 NOTE — Unmapped (Signed)
[facility] Refill Coordination Note    Specialty Medication(s) to be Shipped:   Hematology/Oncology: Michael [name] 40mg        Pricilla [name], DOB: Jul 09, 1935  Phone: [phone] (home)       All above HIPAA information was verified with patient.     Completed refill call assessment today to schedule patient's medication shipment from the [facility] [phone]).       Specialty medication(s) and dose(s) confirmed: Regimen is correct and unchanged.   Changes to medications: Krist reports no changes reported at this time.  Changes to insurance: No  Questions for the pharmacist: No    The patient will receive a drug information handout for each medication shipped and additional FDA Medication Guides as required.      DISEASE/MEDICATION-SPECIFIC INFORMATION        N/A    ADHERENCE     Medication Adherence    Patient reported X missed doses in the last month:  0  Specialty Medication:  Xtandi 40mg   Patient is on additional specialty medications:  No  Patient is on more than two specialty medications:  No  Any gaps in refill history greater than 2 weeks in the last 3 months:  no  Demonstrates understanding of importance of adherence:  yes  Informant:  patient  Reliability of informant:  reliable  Provider-estimated medication adherence level:  good  Patient is at risk for Non-Adherence:  No  Adherence tools used:  medication list   Other adherence tool:  routine   Support network for adherence:  family member  Confirmed plan for next specialty medication refill:  delivery by pharmacy          Refill Coordination    Has the Patients' Contact Information Changed:  No  Is the Shipping Address Different:  No         MEDICARE PART B DOCUMENTATION     Xtandi 40mg : Patient has 7 days worth on hand.    SHIPPING     Shipping address confirmed in Epic.     Delivery Scheduled: Yes, Expected medication delivery date: 03/27/2018 via UPS or courier.     Medication will be delivered via Next Day Courier to the home address in [employer] [place].    Michael [name]   [facility] Specialty Technician

## 2018-03-25 ENCOUNTER — Encounter: Admit: 2018-03-25 | Discharge: 2018-04-07 | Payer: MEDICARE

## 2018-03-25 ENCOUNTER — Encounter
Admit: 2018-03-25 | Discharge: 2018-04-07 | Payer: MEDICARE | Attending: Hematology & Oncology | Primary: Hematology & Oncology

## 2018-03-25 DIAGNOSIS — M8458XA Pathological fracture in neoplastic disease, other specified site, initial encounter for fracture: Principal | ICD-10-CM

## 2018-03-25 DIAGNOSIS — E034 Atrophy of thyroid (acquired): Principal | ICD-10-CM

## 2018-03-25 DIAGNOSIS — C61 Malignant neoplasm of prostate: Principal | ICD-10-CM

## 2018-03-25 DIAGNOSIS — R351 Nocturia: Principal | ICD-10-CM

## 2018-03-25 DIAGNOSIS — I82409 Acute embolism and thrombosis of unspecified deep veins of unspecified lower extremity: Principal | ICD-10-CM

## 2018-03-25 DIAGNOSIS — C7951 Secondary malignant neoplasm of bone: Principal | ICD-10-CM

## 2018-03-25 DIAGNOSIS — E785 Hyperlipidemia, unspecified: Principal | ICD-10-CM

## 2018-03-25 DIAGNOSIS — I10 Essential (primary) hypertension: Principal | ICD-10-CM

## 2018-03-25 DIAGNOSIS — C775 Secondary and unspecified malignant neoplasm of intrapelvic lymph nodes: Principal | ICD-10-CM

## 2018-03-25 DIAGNOSIS — R599 Enlarged lymph nodes, unspecified: Principal | ICD-10-CM

## 2018-03-25 DIAGNOSIS — N139 Obstructive and reflux uropathy, unspecified: Principal | ICD-10-CM

## 2018-03-25 DIAGNOSIS — W3400XA Accidental discharge from unspecified firearms or gun, initial encounter: Principal | ICD-10-CM

## 2018-03-25 DIAGNOSIS — Z950 Presence of cardiac pacemaker: Principal | ICD-10-CM

## 2018-03-25 LAB — CBC W/ AUTO DIFF
BASOPHILS RELATIVE PERCENT: 0.5 %
EOSINOPHILS ABSOLUTE COUNT: 0.1 10*9/L (ref 0.0–0.4)
EOSINOPHILS RELATIVE PERCENT: 2.2 %
HEMATOCRIT: 37.6 % — ABNORMAL LOW (ref 41.0–53.0)
HEMOGLOBIN: 12.5 g/dL — ABNORMAL LOW (ref 13.5–17.5)
HEMOGLOBIN: 12.5 g/dL — ABNORMAL LOW (ref 13.5–17.5)
LYMPHOCYTES ABSOLUTE COUNT: 0.6 10*9/L — ABNORMAL LOW (ref 1.5–5.0)
LYMPHOCYTES RELATIVE PERCENT: 21.6 %
MEAN CORPUSCULAR HEMOGLOBIN CONC: 33.4 g/dL (ref 31.0–37.0)
MEAN CORPUSCULAR HEMOGLOBIN: 32.3 pg (ref 26.0–34.0)
MEAN CORPUSCULAR VOLUME: 96.7 fL (ref 80.0–100.0)
MONOCYTES ABSOLUTE COUNT: 0.3 10*9/L (ref 0.2–0.8)
MONOCYTES RELATIVE PERCENT: 9.7 %
NEUTROPHILS ABSOLUTE COUNT: 1.9 10*9/L — ABNORMAL LOW (ref 2.0–7.5)
NEUTROPHILS RELATIVE PERCENT: 64.7 %
PLATELET COUNT: 191 10*9/L (ref 150–440)
RED BLOOD CELL COUNT: 3.88 10*12/L — ABNORMAL LOW (ref 4.50–5.90)
RED CELL DISTRIBUTION WIDTH: 14.5 % (ref 12.0–15.0)
WBC ADJUSTED: 2.9 10*9/L — ABNORMAL LOW (ref 4.5–11.0)

## 2018-03-25 LAB — PSA: PROSTATE SPECIFIC ANTIGEN: 0.99 ng/mL (ref 0.00–4.00)

## 2018-03-25 LAB — CREATININE
EGFR CKD-EPI AA MALE: >90 mL/min/{1.73_m2} (ref >=60)
EGFR CKD-EPI AA MALE: >90 mL/min/1.73m2 — ABNORMAL LOW (ref >=60–1.30)
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
## 2018-03-25 NOTE — Unmapped (Signed)
Lab Results   Component Value Date    PSA 0.99 03/25/2018    PSA 1.13 02/20/2018    PSA 1.13 01/21/2018    PSA 1.02 12/17/2017    PSA 0.85 10/17/2017    PSA 0.72 09/05/2017     Please call [phone] to reach my nurse navigator Laury Deep for any issues.    For emergencies on Nights, Weekends and Holidays  Call [phone] and ask for the hematology/oncology on call.    Griffin Basil, MD, PhD  Associate Professor of Medicine  Division of Hematology-Oncology    [facility]  Genitourinary Oncology Clinic  Nurse Navigator: Laury Deep  Fax: [phone]

## 2018-03-25 NOTE — Unmapped (Signed)
Labs drawn and sent for analysis.  Care provided by  AHubble, RN

## 2018-03-26 MED FILL — XTANDI 40 MG CAPSULE: 30 days supply | Qty: 90 | Fill #3 | Status: AC

## 2018-03-26 MED FILL — XTANDI 40 MG CAPSULE: 30 days supply | Qty: 90 | Fill #3

## 2018-04-03 DIAGNOSIS — C61 Malignant neoplasm of prostate: Principal | ICD-10-CM

## 2018-04-03 DIAGNOSIS — C7951 Secondary malignant neoplasm of bone: Principal | ICD-10-CM

## 2018-04-17 NOTE — Unmapped (Signed)
[facility] Refill Coordination Note    Specialty Medication(s) to be Shipped:   Hematology/Oncology: Michael [name] 40 mg       Michael [name], DOB: [birth year]  Phone: [phone] (home)       All above HIPAA information was verified with patient.     Completed refill call assessment today to schedule patient's medication shipment from the [facility] [phone]).       Specialty medication(s) and dose(s) confirmed: Regimen is correct and unchanged.   Changes to medications: Michael [name] reports no changes reported at this time.  Changes to insurance: No  Questions for the pharmacist: No    Confirmed patient received Welcome Packet with first shipment. The patient will receive a drug information handout for each medication shipped and additional FDA Medication Guides as required.       DISEASE/MEDICATION-SPECIFIC INFORMATION        N/A    SPECIALTY MEDICATION ADHERENCE     Medication Adherence    Patient reported X missed doses in the last month:  0  Specialty Medication:  XTANDI 40 mg  Patient is on additional specialty medications:  No  Patient is on more than two specialty medications:  No  Any gaps in refill history greater than 2 weeks in the last 3 months:  no  Demonstrates understanding of importance of adherence:  yes  Informant:  spouse  Adherence tools used:  medication list   Other adherence tool:  routine   Support network for adherence:  family member          Refill Coordination    Has the Patients' Contact Information Changed:  No  Is the Shipping Address Different:  No           XTANDI 40 mg : 10 days of medicine on hand       SHIPPING     Shipping address confirmed in Epic.     Delivery Scheduled: Yes, Expected medication delivery date: 04/23/2018.     Medication will be delivered via UPS to the home address in Epic WAM.    [name]   [facility] Specialty Technician

## 2018-04-23 MED FILL — XTANDI 40 MG CAPSULE: 30 days supply | Qty: 90 | Fill #4 | Status: AC

## 2018-04-23 MED FILL — XTANDI 40 MG CAPSULE: 30 days supply | Qty: 90 | Fill #4

## 2018-04-25 NOTE — Unmapped (Signed)
Spoke with pt - let him know that due to COVID 19 and to reduce exposure we are converting his appt to a phone encounter. Pt will still need labs and radium treatment. Pt will keep an eye on his phone on Tuesday 4/14.

## 2018-04-28 NOTE — Unmapped (Signed)
VM not set up  Pt. Has labs and phone visit with whang

## 2018-04-29 ENCOUNTER — Ambulatory Visit: Admit: 2018-04-29 | Discharge: 2018-05-05 | Payer: MEDICARE

## 2018-04-29 ENCOUNTER — Encounter: Admit: 2018-04-29 | Discharge: 2018-05-05 | Payer: MEDICARE

## 2018-04-29 ENCOUNTER — Encounter
Admit: 2018-04-29 | Discharge: 2018-05-05 | Payer: MEDICARE | Attending: Hematology & Oncology | Primary: Hematology & Oncology

## 2018-04-29 DIAGNOSIS — C7951 Secondary malignant neoplasm of bone: Secondary | ICD-10-CM

## 2018-04-29 DIAGNOSIS — C61 Malignant neoplasm of prostate: Secondary | ICD-10-CM

## 2018-04-29 DIAGNOSIS — C775 Secondary and unspecified malignant neoplasm of intrapelvic lymph nodes: Secondary | ICD-10-CM

## 2018-04-29 LAB — CBC W/ AUTO DIFF
BASOPHILS ABSOLUTE COUNT: 0 10*9/L (ref 0.0–0.1)
EOSINOPHILS ABSOLUTE COUNT: 0.1 10*9/L (ref 0.0–0.4)
EOSINOPHILS RELATIVE PERCENT: 2.7 %
HEMATOCRIT: 36.8 % — ABNORMAL LOW (ref 41.0–53.0)
HEMOGLOBIN: 12.7 g/dL — ABNORMAL LOW (ref 13.5–17.5)
LARGE UNSTAINED CELLS: 2 % (ref 0–4)
LYMPHOCYTES ABSOLUTE COUNT: 0.6 10*9/L — ABNORMAL LOW (ref 1.5–5.0)
LYMPHOCYTES RELATIVE PERCENT: 18.9 %
MEAN CORPUSCULAR HEMOGLOBIN CONC: 34.6 g/dL (ref 31.0–37.0)
MEAN CORPUSCULAR HEMOGLOBIN: 33.5 pg (ref 26.0–34.0)
MEAN CORPUSCULAR VOLUME: 97 fL (ref 80.0–100.0)
MEAN CORPUSCULAR VOLUME: 97 fL — ABNORMAL LOW (ref 80.0–100.0)
MEAN PLATELET VOLUME: 8 fL (ref 7.0–10.0)
MONOCYTES RELATIVE PERCENT: 8.1 %
NEUTROPHILS ABSOLUTE COUNT: 2.2 10*9/L (ref 2.0–7.5)
NEUTROPHILS RELATIVE PERCENT: 67.4 %
PLATELET COUNT: 180 10*9/L (ref 150–440)
RED BLOOD CELL COUNT: 3.8 10*12/L — ABNORMAL LOW (ref 4.50–5.90)
RED CELL DISTRIBUTION WIDTH: 15 % (ref 12.0–15.0)
WBC ADJUSTED: 3.3 10*9/L — ABNORMAL LOW (ref 4.5–11.0)

## 2018-04-29 LAB — CREATININE
CREATININE: 0.63 mg/dL — ABNORMAL LOW (ref 0.70–1.30)
EGFR CKD-EPI AA MALE: >90 mL/min/{1.73_m2} (ref >=60)

## 2018-04-29 NOTE — Unmapped (Signed)
Labs drawn by [facility], RN  and sent for analysis.
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
## 2018-06-02 NOTE — Unmapped (Signed)
[facility] Refill Coordination Note    Specialty Medication(s) to be Shipped:   Hematology/Oncology: Michael [name], DOB: September 15, 1935  Phone: [phone] (home)       All above HIPAA information was verified with patient.     Completed refill call assessment today to schedule patient's medication shipment from the [facility] [phone]).       Specialty medication(s) and dose(s) confirmed: Regimen is correct and unchanged.   Changes to medications: Michael [name] reports no changes at this time.  Changes to insurance: No  Questions for the pharmacist: No    Confirmed patient received Welcome Packet with first shipment. The patient will receive a drug information handout for each medication shipped and additional FDA Medication Guides as required.       DISEASE/MEDICATION-SPECIFIC INFORMATION        N/A    SPECIALTY MEDICATION ADHERENCE     Medication Adherence    Specialty Medication:  Xtandi 40 mg  Patient is on additional specialty medications:  No  Patient is on more than two specialty medications:  No  Any gaps in refill history greater than 2 weeks in the last 3 months:  no  Demonstrates understanding of importance of adherence:  yes  Informant:  patient  Reliability of informant:  reliable  Adherence tools used:  medication list   Other adherence tool:  routine   Support network for adherence:  family member  Confirmed plan for next specialty medication refill:  delivery by pharmacy          Refill Coordination    Has the Patients' Contact Information Changed:  No  Is the Shipping Address Different:  No           Xtandi 40 mg: 4 days of medicine on hand       SHIPPING     Shipping address confirmed in Epic.     Delivery Scheduled: Yes, Expected medication delivery date: 06/04/2018.     Medication will be delivered via Next Day Courier to the home address in Epic [place].    Michael [name]   [facility] Specialty Technician

## 2018-06-03 MED FILL — XTANDI 40 MG CAPSULE: 30 days supply | Qty: 90 | Fill #5 | Status: AC

## 2018-06-03 MED FILL — XTANDI 40 MG CAPSULE: 30 days supply | Qty: 90 | Fill #5

## 2018-06-03 NOTE — Unmapped (Signed)
Was unable to leave a message due to the fact that voicemail. Darleen Crocker, CMA

## 2018-06-04 ENCOUNTER — Encounter
Admit: 2018-06-04 | Discharge: 2018-06-05 | Payer: MEDICARE | Attending: Hematology & Oncology | Primary: Hematology & Oncology

## 2018-06-04 DIAGNOSIS — C61 Malignant neoplasm of prostate: Principal | ICD-10-CM

## 2018-06-04 DIAGNOSIS — C7951 Secondary malignant neoplasm of bone: Secondary | ICD-10-CM

## 2018-06-04 DIAGNOSIS — C775 Secondary and unspecified malignant neoplasm of intrapelvic lymph nodes: Secondary | ICD-10-CM

## 2018-06-04 DIAGNOSIS — K521 Toxic gastroenteritis and colitis: Secondary | ICD-10-CM

## 2018-06-04 NOTE — Unmapped (Signed)
-----   Message from Despina Arias sent at 06/04/2018 11:12 AM EDT -----  Regarding: RE: cancel appt for tomorrow  It's been rescheduled for 6/4.  Thanks, Dot Lanes  ----- Message -----  From: Marica Otter, RN  Sent: 06/04/2018  11:08 AM EDT  To: Thresa Ross, #  Subject: RE: cancel appt for tomorrow                     Dot Lanes- can we reschedule radium for 6/4?  Thanks,  Boneta Lucks  ----- Message -----  From: Maurie Boettcher, MD  Sent: 06/04/2018   9:19 AM EDT  To: Marica Otter, RN, [place]  Subject: cancel appt for tomorrow                         Pt not coming in.  See flow sheet for rescheduling in 2 weeks.  Thanks.
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
## 2018-06-17 ENCOUNTER — Encounter
Admit: 2018-06-17 | Discharge: 2018-06-18 | Payer: MEDICARE | Attending: Hematology & Oncology | Primary: Hematology & Oncology

## 2018-06-17 DIAGNOSIS — C7951 Secondary malignant neoplasm of bone: Secondary | ICD-10-CM

## 2018-06-17 DIAGNOSIS — C61 Malignant neoplasm of prostate: Principal | ICD-10-CM

## 2018-06-17 DIAGNOSIS — C775 Secondary and unspecified malignant neoplasm of intrapelvic lymph nodes: Secondary | ICD-10-CM

## 2018-06-18 NOTE — Unmapped (Signed)
Voice mail not set up, so no screening done.

## 2018-06-19 ENCOUNTER — Encounter: Admit: 2018-06-19 | Discharge: 2018-06-30 | Payer: MEDICARE

## 2018-06-19 ENCOUNTER — Ambulatory Visit: Admit: 2018-06-19 | Discharge: 2018-06-30 | Payer: MEDICARE | Attending: Family | Primary: Family

## 2018-06-19 ENCOUNTER — Ambulatory Visit: Admit: 2018-06-19 | Discharge: 2018-06-30 | Payer: MEDICARE

## 2018-06-19 DIAGNOSIS — C61 Malignant neoplasm of prostate: Secondary | ICD-10-CM

## 2018-06-19 DIAGNOSIS — C7951 Secondary malignant neoplasm of bone: Secondary | ICD-10-CM

## 2018-06-19 DIAGNOSIS — L03115 Cellulitis of right lower limb: Principal | ICD-10-CM

## 2018-06-19 DIAGNOSIS — B353 Tinea pedis: Secondary | ICD-10-CM

## 2018-06-19 LAB — CBC W/ AUTO DIFF
BASOPHILS ABSOLUTE COUNT: 0 10*9/L (ref 0.0–0.1)
BASOPHILS RELATIVE PERCENT: 0.6 %
EOSINOPHILS RELATIVE PERCENT: 3 %
HEMATOCRIT: 35.5 % — ABNORMAL LOW (ref 41.0–53.0)
HEMOGLOBIN: 12.1 g/dL — ABNORMAL LOW (ref 13.5–17.5)
LARGE UNSTAINED CELLS: 3 % (ref 0–4)
LYMPHOCYTES ABSOLUTE COUNT: 0.7 10*9/L — ABNORMAL LOW (ref 1.5–5.0)
LYMPHOCYTES RELATIVE PERCENT: 18 %
MEAN CORPUSCULAR HEMOGLOBIN CONC: 34 g/dL (ref 31.0–37.0)
MEAN CORPUSCULAR HEMOGLOBIN: 33.4 pg (ref 26.0–34.0)
MEAN PLATELET VOLUME: 7.9 fL (ref 7.0–10.0)
MONOCYTES ABSOLUTE COUNT: 0.3 10*9/L (ref 0.2–0.8)
MONOCYTES RELATIVE PERCENT: 7.9 %
NEUTROPHILS ABSOLUTE COUNT: 2.5 10*9/L (ref 2.0–7.5)
NEUTROPHILS RELATIVE PERCENT: 67.7 %
RED BLOOD CELL COUNT: 3.61 10*12/L — ABNORMAL LOW (ref 4.50–5.90)
RED CELL DISTRIBUTION WIDTH: 14.7 % (ref 12.0–15.0)
WBC ADJUSTED: 3.6 10*9/L — ABNORMAL LOW (ref 4.5–11.0)

## 2018-06-19 LAB — CREATININE: Creatinine:MCnc:Pt:Ser/Plas:Qn:: 0.67 — ABNORMAL LOW

## 2018-06-19 LAB — RED CELL DISTRIBUTION WIDTH: Lab: 14.7

## 2018-06-19 LAB — CALCIUM: Calcium:MCnc:Pt:Ser/Plas:Qn:: 9.3

## 2018-06-19 LAB — ALBUMIN: Albumin:MCnc:Pt:Ser/Plas:Qn:: 3.7

## 2018-06-19 LAB — PHOSPHORUS: Phosphate:MCnc:Pt:Ser/Plas:Qn:: 3.4

## 2018-06-19 LAB — PROSTATE SPECIFIC ANTIGEN: Prostate specific Ag:MCnc:Pt:Ser/Plas:Qn:: <0.1

## 2018-06-19 MED ORDER — TERBINAFINE HCL 250 MG TABLET
ORAL_TABLET | Freq: Every day | ORAL | 0 refills | 0 days | Status: CP
Start: 2018-06-19 — End: ?

## 2018-06-19 MED ORDER — CLINDAMYCIN HCL 300 MG CAPSULE
ORAL_CAPSULE | Freq: Four times a day (QID) | ORAL | 0 refills | 0.00000 days | Status: CP
Start: 2018-06-19 — End: 2018-07-14
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
## 2018-06-19 NOTE — Unmapped (Signed)
Patient Education      Patient Education        Cellulitis: Care Instructions  Your Care Instructions     Cellulitis is a skin infection caused by bacteria, most often strep or staph. It often occurs after a break in the skin from a scrape, cut, bite, or puncture, or after a rash.  Cellulitis may be treated without doing tests to find out what caused it. But your doctor may do tests, if needed, to look for a specific bacteria, like methicillin-resistant Staphylococcus aureus (MRSA).  The doctor has checked you carefully, but problems can develop later. If you notice any problems or new symptoms, get medical treatment right away.  Follow-up care is a key part of your treatment and safety. Be sure to make and go to all appointments, and call your doctor if you are having problems. It's also a good idea to know your test results and keep a list of the medicines you take.  How can you care for yourself at home?  ?? Take your antibiotics as directed. Do not stop taking them just because you feel better. You need to take the full course of antibiotics.  ?? Prop up the infected area on pillows to reduce pain and swelling. Try to keep the area above the level of your heart as often as you can.  ?? If your doctor told you how to care for your wound, follow your doctor's instructions. If you did not get instructions, follow this general advice:  ? Wash the wound with clean water 2 times a day. Don't use hydrogen peroxide or alcohol, which can slow healing.  ? You may cover the wound with a thin layer of petroleum jelly, such as Vaseline, and a nonstick bandage.  ? Apply more petroleum jelly and replace the bandage as needed.  ?? Be safe with medicines. Take pain medicines exactly as directed.  ? If the doctor gave you a prescription medicine for pain, take it as prescribed.  ? If you are not taking a prescription pain medicine, ask your doctor if you can take an over-the-counter medicine.  To prevent cellulitis in the future  ?? Try to prevent cuts, scrapes, or other injuries to your skin. Cellulitis most often occurs where there is a break in the skin.  ?? If you get a scrape, cut, mild burn, or bite, wash the wound with clean water as soon as you can to help avoid infection. Don't use hydrogen peroxide or alcohol, which can slow healing.  ?? If you have swelling in your legs (edema), support stockings and good skin care may help prevent leg sores and cellulitis.  ?? Take care of your feet, especially if you have diabetes or other conditions that increase the risk of infection. Wear shoes and socks. Do not go barefoot. If you have athlete's foot or other skin problems on your feet, talk to your doctor about how to treat them.  When should you call for help?   Call your doctor now or seek immediate medical care if:  ?? You have signs that your infection is getting worse, such as:  ? Increased pain, swelling, warmth, or redness.  ? Red streaks leading from the area.  ? Pus draining from the area.  ? A fever.  ?? You get a rash.  Watch closely for changes in your health, and be sure to contact your doctor if:  ?? You do not get better as expected.  Where can you learn  more?  Go to [facility] at https://myuncchart.org  [facility] [employer] [facility] under [employer]. Enter [phone] in the search box to learn more about Cellulitis: Care Instructions.  Current as of: November 14, 2017??????????????????????????????Content Version: 12.5  ?? 2006-2020 [employer].   Care instructions adapted under license by [facility]. If you have questions about a medical condition or this instruction, always ask your healthcare professional. Healthwise, Incorporated disclaims any warranty or liability for your use of this information.         Athlete's Foot: Care Instructions  Your Care Instructions     Athlete's foot is an itchy rash on the foot caused by an infection with a fungus. You can get it by going barefoot in wet public areas, such as swimming pools or locker rooms. Many times there is no clear reason why you get athlete's foot. You can easily treat athlete's foot by putting medicine on your feet for 1 to 6 weeks. In some cases, a doctor may prescribe pills to kill the fungus.  Follow-up care is a key part of your treatment and safety. Be sure to make and go to all appointments, and call your doctor if you are having problems. It's also a good idea to know your test results and keep a list of the medicines you take.  How can you care for yourself at home?  ?? Your doctor may suggest an over-the counter lotion or spray or may prescribe a medicine. Take your medicines exactly as prescribed. Call your doctor if you think you are having a problem with your medicine.  ?? Keep your feet clean and dry.  ?? When you get dressed, put your socks on before your underwear. This can prevent the fungus from spreading from your feet to your groin.  To prevent athlete's foot  ?? Wear flip-flops or other shower sandals in public locker rooms and showers and by the pool.  ?? Dry between your toes after swimming or bathing.  ?? Wear leather shoes or sandals, which let air get to your feet.  ?? Change your socks as needed so your feet stay as dry as possible.  ?? Use antifungal powder on your feet.  When should you call for help?  Watch closely for changes in your health, and be sure to contact your doctor if:  ?? You do not get better as expected.  Where can you learn more?  Go to [facility] at https://myuncchart.org  [employer] [facility] under [employer]. Enter M498 in the search box to learn more about Athlete's Foot: Care Instructions.  Current as of: November 14, 2017??????????????????????????????Content Version: 12.5  ?? 2006-2020 Healthwise, Inc[employer].   Care instructions adapted under license by [facility]. If you have questions about a medical condition or this instruction, always ask your healthcare professional. Healthwise, Incorporated disclaims any warranty or liability for your use of this information.

## 2018-06-19 NOTE — Unmapped (Signed)
PIV placed by RAMADOR   - labs drawn and sent for analysis.

## 2018-06-20 NOTE — Unmapped (Signed)
Left message for Mr. Vanrossum to return call in reference to lab appt scheduled for 7/9.    [name]

## 2018-06-20 NOTE — Unmapped (Signed)
-----   Message from Maurie Boettcher, MD sent at 06/19/2018 12:59 PM EDT -----  Regarding: Lab visit on 7/2  Lashaun,    To be changed to 7/9.  Also, please move his lab from [facility] to [facility].    Boneta Lucks,    I want him to get his Radium on 7/9, five weeks from his infusion today.  I want to space this out, so that he doesn't get toxicity.    Thanks.

## 2018-07-08 NOTE — Unmapped (Signed)
[facility] Refill Coordination Note    Specialty Medication(s) to be Shipped:   Hematology/Oncology: Xtandi 40 MG       Michael [name], DOB: Mar 27, 1935  Phone: [phone] (home)       All above HIPAA information was verified with patient.     Completed refill call assessment today to schedule patient's medication shipment from the [facility] [phone]).       Specialty medication(s) and dose(s) confirmed: Regimen is correct and unchanged.   Changes to medications: [name] reports no changes at this time.  Changes to insurance: No  Questions for the pharmacist: No    Confirmed patient received Welcome Packet with first shipment. The patient will receive a drug information handout for each medication shipped and additional FDA Medication Guides as required.       DISEASE/MEDICATION-SPECIFIC INFORMATION        N/A    SPECIALTY MEDICATION ADHERENCE     Medication Adherence    Patient reported X missed doses in the last month:  0  Specialty Medication:  Xtandi 40 MG  Patient is on additional specialty medications:  No  Patient is on more than two specialty medications:  No  Any gaps in refill history greater than 2 weeks in the last 3 months:  no  Demonstrates understanding of importance of adherence:  yes  Informant:  patient  Reliability of informant:  reliable  Adherence tools used:  medication list   Other adherence tool:  routine   Support network for adherence:  family member  Confirmed plan for next specialty medication refill:  delivery by pharmacy          Refill Coordination    Has the Patients' Contact Information Changed:  No  Is the Shipping Address Different:  No           Xtandi 40 MG  : 4 days of medicine on hand       SHIPPING     Shipping address confirmed in Epic.     Delivery Scheduled: Yes, Expected medication delivery date: 07/10/2018.     Medication will be delivered via Same Day Courier to the home address in [employer] [place].    [name]   [facility] Specialty Technician

## 2018-07-09 MED FILL — XTANDI 40 MG CAPSULE: 30 days supply | Qty: 90 | Fill #6

## 2018-07-09 MED FILL — XTANDI 40 MG CAPSULE: 30 days supply | Qty: 90 | Fill #6 | Status: AC
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
## 2018-07-11 MED ORDER — EUTHYROX 100 MCG TABLET
ORAL_TABLET | 0 refills | 0 days | Status: CP
Start: 2018-07-11 — End: ?

## 2018-07-14 ENCOUNTER — Encounter: Admit: 2018-07-14 | Discharge: 2018-07-15 | Payer: MEDICARE | Attending: Family | Primary: Family

## 2018-07-14 DIAGNOSIS — R600 Localized edema: Secondary | ICD-10-CM

## 2018-07-14 DIAGNOSIS — E034 Atrophy of thyroid (acquired): Secondary | ICD-10-CM

## 2018-07-14 DIAGNOSIS — E785 Hyperlipidemia, unspecified: Secondary | ICD-10-CM

## 2018-07-14 DIAGNOSIS — I10 Essential (primary) hypertension: Principal | ICD-10-CM

## 2018-07-15 ENCOUNTER — Encounter
Admit: 2018-07-15 | Discharge: 2018-07-16 | Payer: MEDICARE | Attending: Hematology & Oncology | Primary: Hematology & Oncology

## 2018-07-15 DIAGNOSIS — C61 Malignant neoplasm of prostate: Principal | ICD-10-CM

## 2018-07-15 DIAGNOSIS — C775 Secondary and unspecified malignant neoplasm of intrapelvic lymph nodes: Secondary | ICD-10-CM

## 2018-07-15 DIAGNOSIS — C7951 Secondary malignant neoplasm of bone: Secondary | ICD-10-CM
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
## 2018-07-18 MED ORDER — LOVASTATIN 40 MG TABLET
ORAL_TABLET | Freq: Every evening | ORAL | 0 refills | 0 days | Status: CP
Start: 2018-07-18 — End: 2019-07-18

## 2018-07-21 NOTE — Unmapped (Signed)
Called pt's wife was able to speak with [name]. [name] is aware of labs scheduled in [facility] on 7/14 and f/u phone consultation with Dr. Philomena Course on 7/16.    [name]

## 2018-07-21 NOTE — Unmapped (Signed)
Called to notify pt of 2wk f/u appts for labs and Dr. Philomena Course, unable to reach pt or leave message.    Michael [name]

## 2018-07-24 ENCOUNTER — Encounter: Admit: 2018-07-24 | Discharge: 2018-07-28 | Payer: MEDICARE

## 2018-07-24 DIAGNOSIS — C7951 Secondary malignant neoplasm of bone: Secondary | ICD-10-CM

## 2018-07-24 DIAGNOSIS — C61 Malignant neoplasm of prostate: Secondary | ICD-10-CM

## 2018-07-24 LAB — CBC W/ AUTO DIFF
BASOPHILS ABSOLUTE COUNT: 0 10*9/L (ref 0.0–0.1)
BASOPHILS RELATIVE PERCENT: 0.9 %
EOSINOPHILS ABSOLUTE COUNT: 0.1 10*9/L (ref 0.0–0.4)
HEMATOCRIT: 36.9 % — ABNORMAL LOW (ref 41.0–53.0)
HEMOGLOBIN: 12.5 g/dL — ABNORMAL LOW (ref 13.5–17.5)
LARGE UNSTAINED CELLS: 3 % (ref 0–4)
LYMPHOCYTES ABSOLUTE COUNT: 0.5 10*9/L — ABNORMAL LOW (ref 1.5–5.0)
LYMPHOCYTES RELATIVE PERCENT: 16.8 %
MEAN CORPUSCULAR HEMOGLOBIN CONC: 33.8 g/dL (ref 31.0–37.0)
MEAN CORPUSCULAR HEMOGLOBIN: 34.1 pg — ABNORMAL HIGH (ref 26.0–34.0)
MONOCYTES ABSOLUTE COUNT: 0.2 10*9/L (ref 0.2–0.8)
MONOCYTES RELATIVE PERCENT: 6.4 %
NEUTROPHILS ABSOLUTE COUNT: 2.1 10*9/L (ref 2.0–7.5)
NEUTROPHILS RELATIVE PERCENT: 69.3 %
PLATELET COUNT: 165 10*9/L (ref 150–440)
RED BLOOD CELL COUNT: 3.66 10*12/L — ABNORMAL LOW (ref 4.50–5.90)
RED CELL DISTRIBUTION WIDTH: 15 % (ref 12.0–15.0)
WBC ADJUSTED: 3 10*9/L — ABNORMAL LOW (ref 4.5–11.0)

## 2018-07-24 LAB — MONOCYTES RELATIVE PERCENT: Lab: 6.4

## 2018-07-24 LAB — ALBUMIN: Albumin:MCnc:Pt:Ser/Plas:Qn:: 3.7

## 2018-07-24 LAB — PHOSPHORUS: Phosphate:MCnc:Pt:Ser/Plas:Qn:: 3.4

## 2018-07-24 LAB — PROSTATE SPECIFIC ANTIGEN: Prostate specific Ag:MCnc:Pt:Ser/Plas:Qn:: <0.1

## 2018-07-24 LAB — EGFR CKD-EPI NON-AA MALE: Lab: >90

## 2018-07-24 LAB — CALCIUM: Calcium:MCnc:Pt:Ser/Plas:Qn:: 9

## 2018-07-24 NOTE — Unmapped (Signed)
PIV placed by RN Virl Axe, labs drawn and sent.

## 2018-07-29 ENCOUNTER — Ambulatory Visit: Admit: 2018-07-29 | Discharge: 2018-07-30 | Payer: MEDICARE

## 2018-07-29 DIAGNOSIS — C61 Malignant neoplasm of prostate: Secondary | ICD-10-CM

## 2018-07-29 DIAGNOSIS — C7951 Secondary malignant neoplasm of bone: Secondary | ICD-10-CM

## 2018-07-29 LAB — PHOSPHORUS: Phosphate:MCnc:Pt:Ser/Plas:Qn:: 3.3

## 2018-07-29 LAB — ALBUMIN
ALBUMIN: 3.9 g/dL (ref 3.5–5.0)
Albumin:MCnc:Pt:Ser/Plas:Qn:: 3.9

## 2018-07-29 LAB — CBC W/ AUTO DIFF
BASOPHILS ABSOLUTE COUNT: 0 10*9/L (ref 0.0–0.1)
EOSINOPHILS ABSOLUTE COUNT: 0.1 10*9/L (ref 0.0–0.4)
EOSINOPHILS RELATIVE PERCENT: 2 %
HEMATOCRIT: 37 % — ABNORMAL LOW (ref 41.0–53.0)
HEMOGLOBIN: 12.6 g/dL — ABNORMAL LOW (ref 13.5–17.5)
LARGE UNSTAINED CELLS: 3 % (ref 0–4)
LYMPHOCYTES ABSOLUTE COUNT: 0.8 10*9/L — ABNORMAL LOW (ref 1.5–5.0)
LYMPHOCYTES RELATIVE PERCENT: 20.9 %
MEAN CORPUSCULAR HEMOGLOBIN CONC: 34.2 g/dL (ref 31.0–37.0)
MEAN CORPUSCULAR HEMOGLOBIN: 34.9 pg — ABNORMAL HIGH (ref 26.0–34.0)
MEAN CORPUSCULAR VOLUME: 101.8 fL — ABNORMAL HIGH (ref 80.0–100.0)
MEAN PLATELET VOLUME: 8.5 fL (ref 7.0–10.0)
MONOCYTES ABSOLUTE COUNT: 0.3 10*9/L (ref 0.2–0.8)
MONOCYTES RELATIVE PERCENT: 7.6 %
NEUTROPHILS ABSOLUTE COUNT: 2.4 10*9/L (ref 2.0–7.5)
NEUTROPHILS RELATIVE PERCENT: 66 %
PLATELET COUNT: 183 10*9/L (ref 150–440)
RED BLOOD CELL COUNT: 3.63 10*12/L — ABNORMAL LOW (ref 4.50–5.90)
RED CELL DISTRIBUTION WIDTH: 14.2 % (ref 12.0–15.0)

## 2018-07-29 LAB — WBC ADJUSTED: Lab: 3.6 — ABNORMAL LOW

## 2018-07-29 LAB — CREATININE: EGFR CKD-EPI AA MALE: >90 mL/min/{1.73_m2} (ref >=60)

## 2018-07-29 LAB — EGFR CKD-EPI NON-AA MALE: Lab: >90

## 2018-07-29 LAB — CALCIUM: Calcium:MCnc:Pt:Ser/Plas:Qn:: 9.5

## 2018-07-29 LAB — PROSTATE SPECIFIC ANTIGEN: Prostate specific Ag:MCnc:Pt:Ser/Plas:Qn:: <0.1

## 2018-07-31 ENCOUNTER — Encounter
Admit: 2018-07-31 | Discharge: 2018-08-01 | Payer: MEDICARE | Attending: Hematology & Oncology | Primary: Hematology & Oncology

## 2018-07-31 DIAGNOSIS — C61 Malignant neoplasm of prostate: Principal | ICD-10-CM

## 2018-07-31 DIAGNOSIS — C775 Secondary and unspecified malignant neoplasm of intrapelvic lymph nodes: Secondary | ICD-10-CM
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
## 2018-08-01 NOTE — Unmapped (Signed)
[facility] Refill Coordination Note    Specialty Medication(s) to be Shipped:   Hematology/Oncology: Michael [name], DOB: 1935/05/02  Phone: [phone] (home)       All above HIPAA information was verified with patient.     Completed refill call assessment today to schedule patient's medication shipment from the [facility] [phone]).       Specialty medication(s) and dose(s) confirmed: Regimen is correct and unchanged.   Changes to medications: Kieffer reports no changes at this time.  Changes to insurance: No  Questions for the pharmacist: No    Confirmed patient received Welcome Packet with first shipment. The patient will receive a drug information handout for each medication shipped and additional FDA Medication Guides as required.       DISEASE/MEDICATION-SPECIFIC INFORMATION        N/A    SPECIALTY MEDICATION ADHERENCE     Medication Adherence    Patient reported X missed doses in the last month: 0  Specialty Medication: Xtandi 40 mg  Patient is on additional specialty medications: No  Patient is on more than two specialty medications: No  Any gaps in refill history greater than 2 weeks in the last 3 months: no  Demonstrates understanding of importance of adherence: yes  Informant: patient  Reliability of informant: reliable  Adherence tools used: medication list   Other adherence tool: routine   Support network for adherence: family member  Confirmed plan for next specialty medication refill: delivery by pharmacy          Refill Coordination    Has the Patients' Contact Information Changed: No  Is the Shipping Address Different: No           Xtandi 40 mg  : 8 days of medicine on hand          SHIPPING     Shipping address confirmed in Epic.     Delivery Scheduled: Yes, Expected medication delivery date: 08/08/2018.     Medication will be delivered via Same Day Courier to the home address in [employer] [place].    [name]   [facility] Specialty Technician

## 2018-08-01 NOTE — Unmapped (Signed)
Lab Results   Component Value Date    PSA <0.10 07/29/2018    PSA <0.10 07/24/2018    PSA <0.10 06/19/2018    PSA 0.31 04/29/2018    PSA 0.99 03/25/2018    PSA 1.13 02/20/2018     Please call [phone] to reach my nurse navigator Mauricia Area for any issues.    For emergencies on Nights, Weekends and Holidays  Call [phone] and ask for the hematology/oncology on call.    Griffin Basil, MD, PhD  Associate Professor of Medicine  Division of Hematology-Oncology    [facility]  Genitourinary Oncology Clinic  Nurse Navigator: Mauricia Area  Fax: [phone]

## 2018-08-08 MED FILL — XTANDI 40 MG CAPSULE: 30 days supply | Qty: 90 | Fill #7

## 2018-08-08 MED FILL — XTANDI 40 MG CAPSULE: 30 days supply | Qty: 90 | Fill #7 | Status: AC

## 2018-08-27 NOTE — Unmapped (Signed)
Outreached to patient to schedule Annual Wellness Visit.  Patient was unable to be contacted. Left Message.    Abstraction Result Flowsheet Data    This patient's last AWV date: [facility] Last Medicare Wellness Visit Date: 03/29/2017  This patients last WCC/CPE date: : Not Found      Reason for Encounter  Reason for Encounter: Outreach  Primary Reason for Call: AWV  Outreach Call Outcome: No voicemail available

## 2018-08-28 NOTE — Unmapped (Signed)
[facility] Clinical Assessment & Refill Coordination Note    Michael [name], DOB: Dec 11, 1935  Phone: [phone] (home)     All above HIPAA information was verified with patient.     Specialty Medication(s):   Hematology/Oncology: [name]     Current Outpatient Medications   Medication Sig Dispense Refill   ??? acetaminophen (TYLENOL) 325 MG tablet Take 650 mg by mouth two (2) times a day.      ??? aspirin (ECOTRIN) 81 MG tablet Take 81 mg by mouth daily.     ??? CALCIUM CARBONATE/VITAMIN D3 (CALCIUM 600 WITH VITAMIN D3 ORAL) Take 1 tablet by mouth Two (2) times a day.      ??? cetirizine (ZYRTEC) 10 MG tablet Take 1 tablet (10 mg total) by mouth daily. 30 tablet 1   ??? chlorthalidone (HYGROTON) 25 MG tablet Take 0.5 tablets (12.5 mg total) by mouth daily. 90 tablet 3   ??? CHOLECALCIFEROL, VITAMIN D3, (VITAMIN D3 ORAL) Take 1 capsule by mouth once daily. Unsure of dose     ??? enzalutamide (XTANDI) 40 mg capsule TAKE 3 CAPSULES (120MG ) BY MOUTH ONCE DAILY 90 each 11   ??? EUTHYROX 100 mcg tablet Take 1 tablet by mouth once daily 90 tablet 0   ??? gabapentin (NEURONTIN) 300 MG capsule Take 300 mg by mouth nightly.     ??? lovastatin (MEVACOR) 40 MG tablet Take 1 tablet (40 mg total) by mouth every evening. 90 tablet 0   ??? terbinafine HCL (LAMISIL) 250 mg tablet Take 1 tablet (250 mg total) by mouth daily. 14 tablet 0   ??? vitamin B comp and C no.3 15-10-50-5-300 mg TbER Take 1 tablet by mouth once daily.        No current facility-administered medications for this visit.         Changes to medications: Kei reports no changes at this time.    No Known Allergies    Changes to allergies: No    SPECIALTY MEDICATION ADHERENCE     Xtandi 40 mg: 10 days of medicine on hand     Medication Adherence    Adherence tools used: medication list   Other adherence tool: routine   Support network for adherence: family member          Specialty medication(s) dose(s) confirmed: Regimen is correct and unchanged.     Are there any concerns with adherence? No    Adherence counseling provided? Not needed    CLINICAL MANAGEMENT AND INTERVENTION      Clinical Benefit Assessment:    Do you feel the medicine is effective or helping your condition? Yes    Clinical Benefit counseling provided? Progress note from 07/31/18 shows evidence of clinical benefit    Adverse Effects Assessment:    Are you experiencing any side effects? No    Are you experiencing difficulty administering your medicine? No    Quality of Life Assessment:    How many days over the past month did your prostate cancer  keep you from your normal activities? For example, brushing your teeth or getting up in the morning. 0    Have you discussed this with your provider? Not needed    Therapy Appropriateness:    Is therapy appropriate? Yes, therapy is appropriate and should be continued    DISEASE/MEDICATION-SPECIFIC INFORMATION      N/A    PATIENT SPECIFIC NEEDS     ? Does the patient have any physical, cognitive, or cultural barriers?  No    ? Is the patient high risk? No     ? Does the patient require a Care Management Plan? No     ? Does the patient require physician intervention or other additional services (i.e. nutrition, smoking cessation, social work)? No      SHIPPING     Specialty Medication(s) to be Shipped:   Hematology/Oncology: [name]    Other medication(s) to be shipped: none     Changes to insurance: No    Delivery Scheduled: Yes, Expected medication delivery date: 09/02/18.     Medication will be delivered via Next Day Courier to the confirmed home address in [facility].    The patient will receive a drug information handout for each medication shipped and additional FDA Medication Guides as required.  Verified that patient has previously received a Conservation officer, historic buildings.    All of the patient's questions and concerns have been addressed.    Breck Coons [facility] Specialty Pharmacist

## 2018-09-01 MED FILL — XTANDI 40 MG CAPSULE: 30 days supply | Qty: 90 | Fill #8

## 2018-09-01 MED FILL — XTANDI 40 MG CAPSULE: 30 days supply | Qty: 90 | Fill #8 | Status: AC

## 2018-09-30 NOTE — Unmapped (Signed)
[facility] Refill Coordination Note    Specialty Medication(s) to be Shipped:   Hematology/Oncology: Michael [name] 40 mg       Michael [name], DOB: [birth year]  Phone: [phone] (home)       All above HIPAA information was verified with patient's family member.     Completed refill call assessment today to schedule patient's medication shipment from the [facility] [phone]).       Specialty medication(s) and dose(s) confirmed: Regimen is correct and unchanged.   Changes to medications: Michael [name] reports no changes at this time.  Changes to insurance: No  Questions for the pharmacist: No    Confirmed patient received Welcome Packet with first shipment. The patient will receive a drug information handout for each medication shipped and additional FDA Medication Guides as required.       DISEASE/MEDICATION-SPECIFIC INFORMATION        N/A    SPECIALTY MEDICATION ADHERENCE     Medication Adherence    Patient reported X missed doses in the last month: 0  Specialty Medication: Xtandi 40 mg  Patient is on additional specialty medications: No  Patient is on more than two specialty medications: No  Any gaps in refill history greater than 2 weeks in the last 3 months: no  Demonstrates understanding of importance of adherence: yes  Informant: spouse  Reliability of informant: reliable  Adherence tools used: medication list   Other adherence tool: routine   Support network for adherence: family member  Confirmed plan for next specialty medication refill: delivery by pharmacy          Refill Coordination    Has the Patients' Contact Information Changed: No  Is the Shipping Address Different: No           Xtandi 40 mg  : 4 days of medicine on hand       SHIPPING     Shipping address confirmed in Epic.     Delivery Scheduled: Yes, Expected medication delivery date: 10/02/2018.     Medication will be delivered via Same Day Courier to the home address in [employer] [place].    Michael [name]   [facility] Specialty Technician

## 2018-10-02 MED FILL — XTANDI 40 MG CAPSULE: 30 days supply | Qty: 90 | Fill #9 | Status: AC

## 2018-10-02 MED FILL — XTANDI 40 MG CAPSULE: 30 days supply | Qty: 90 | Fill #9

## 2018-10-09 ENCOUNTER — Encounter
Admit: 2018-10-09 | Discharge: 2018-10-10 | Payer: MEDICARE | Attending: Hematology & Oncology | Primary: Hematology & Oncology

## 2018-10-09 ENCOUNTER — Encounter: Admit: 2018-10-09 | Discharge: 2018-10-10 | Payer: MEDICARE

## 2018-10-09 DIAGNOSIS — C61 Malignant neoplasm of prostate: Secondary | ICD-10-CM

## 2018-10-09 DIAGNOSIS — Z86718 Personal history of other venous thrombosis and embolism: Secondary | ICD-10-CM

## 2018-10-09 DIAGNOSIS — R351 Nocturia: Secondary | ICD-10-CM

## 2018-10-09 DIAGNOSIS — I509 Heart failure, unspecified: Secondary | ICD-10-CM

## 2018-10-09 DIAGNOSIS — E039 Hypothyroidism, unspecified: Secondary | ICD-10-CM

## 2018-10-09 DIAGNOSIS — M4807 Spinal stenosis, lumbosacral region: Secondary | ICD-10-CM

## 2018-10-09 DIAGNOSIS — Z7982 Long term (current) use of aspirin: Secondary | ICD-10-CM

## 2018-10-09 DIAGNOSIS — C775 Secondary and unspecified malignant neoplasm of intrapelvic lymph nodes: Secondary | ICD-10-CM

## 2018-10-09 DIAGNOSIS — M47816 Spondylosis without myelopathy or radiculopathy, lumbar region: Secondary | ICD-10-CM

## 2018-10-09 DIAGNOSIS — E785 Hyperlipidemia, unspecified: Secondary | ICD-10-CM

## 2018-10-09 DIAGNOSIS — N320 Bladder-neck obstruction: Secondary | ICD-10-CM

## 2018-10-09 DIAGNOSIS — N393 Stress incontinence (female) (male): Secondary | ICD-10-CM

## 2018-10-09 DIAGNOSIS — Z950 Presence of cardiac pacemaker: Secondary | ICD-10-CM

## 2018-10-09 DIAGNOSIS — I10 Essential (primary) hypertension: Secondary | ICD-10-CM

## 2018-10-09 DIAGNOSIS — R590 Localized enlarged lymph nodes: Secondary | ICD-10-CM

## 2018-10-09 DIAGNOSIS — C7951 Secondary malignant neoplasm of bone: Secondary | ICD-10-CM

## 2018-10-09 LAB — PHOSPHORUS: Phosphate:MCnc:Pt:Ser/Plas:Qn:: 3.4

## 2018-10-09 LAB — CBC W/ AUTO DIFF
BASOPHILS ABSOLUTE COUNT: 0 10*9/L (ref 0.0–0.1)
BASOPHILS RELATIVE PERCENT: 0.8 %
EOSINOPHILS ABSOLUTE COUNT: 0.1 10*9/L (ref 0.0–0.4)
EOSINOPHILS RELATIVE PERCENT: 3.1 %
LARGE UNSTAINED CELLS: 2 % (ref 0–4)
LYMPHOCYTES ABSOLUTE COUNT: 0.8 10*9/L — ABNORMAL LOW (ref 1.5–5.0)
LYMPHOCYTES RELATIVE PERCENT: 19.8 %
MEAN CORPUSCULAR HEMOGLOBIN CONC: 33.7 g/dL (ref 31.0–37.0)
MEAN CORPUSCULAR HEMOGLOBIN: 33.9 pg (ref 26.0–34.0)
MEAN CORPUSCULAR VOLUME: 100.8 fL — ABNORMAL HIGH (ref 80.0–100.0)
MEAN PLATELET VOLUME: 7.7 fL (ref 7.0–10.0)
MONOCYTES ABSOLUTE COUNT: 0.3 10*9/L (ref 0.2–0.8)
MONOCYTES RELATIVE PERCENT: 7.3 %
NEUTROPHILS ABSOLUTE COUNT: 2.6 10*9/L (ref 2.0–7.5)
NEUTROPHILS RELATIVE PERCENT: 67.2 %
PLATELET COUNT: 193 10*9/L (ref 150–440)
RED BLOOD CELL COUNT: 3.67 10*12/L — ABNORMAL LOW (ref 4.50–5.90)
RED CELL DISTRIBUTION WIDTH: 14.7 % (ref 12.0–15.0)
WBC ADJUSTED: 3.9 10*9/L — ABNORMAL LOW (ref 4.5–11.0)

## 2018-10-09 LAB — CREATININE
EGFR CKD-EPI AA MALE: >90 mL/min/{1.73_m2} (ref >=60)
EGFR CKD-EPI NON-AA MALE: >90 mL/min/{1.73_m2} (ref >=60)

## 2018-10-09 LAB — NEUTROPHILS ABSOLUTE COUNT: Neutrophils:NCnc:Pt:Bld:Qn:Automated count: 2.6

## 2018-10-09 LAB — ALBUMIN: Albumin:MCnc:Pt:Ser/Plas:Qn:: 3.9

## 2018-10-09 LAB — PROSTATE SPECIFIC ANTIGEN: Prostate specific Ag:MCnc:Pt:Ser/Plas:Qn:: <0.1

## 2018-10-09 LAB — EGFR CKD-EPI NON-AA MALE: Lab: >90

## 2018-10-09 LAB — CALCIUM: Calcium:MCnc:Pt:Ser/Plas:Qn:: 9.1

## 2018-10-09 NOTE — Unmapped (Addendum)
Lab Results   Component Value Date    PSA <0.10 07/29/2018    PSA <0.10 07/24/2018    PSA <0.10 06/19/2018    PSA 0.31 04/29/2018    PSA 0.99 03/25/2018    PSA 1.13 02/20/2018   Continue with the same medication.    Please call [phone] to reach my nurse navigator Mauricia Area for any issues.    For emergencies on Nights, Weekends and Holidays  Call [phone] and ask for the hematology/oncology on call.    Griffin Basil, MD, PhD  Associate Professor of Medicine  Division of Hematology-Oncology    [facility]  Genitourinary Oncology Clinic  Nurse Navigator: Mauricia Area  Fax: [phone]

## 2018-10-09 NOTE — Unmapped (Signed)
Labs drawn peripherally by yvette Cheek CST.
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
## 2018-10-10 NOTE — Unmapped (Signed)
Called to let pt know per Dr. Philomena Course PSA is zero. Pt was very appreciative of the call and pleased with the results.

## 2018-10-14 NOTE — Unmapped (Signed)
Spoke with Michael [name], he's aware of appts scheduled on 12/8 for labs, injection and f/u with Dr. Philomena Course.    [name]

## 2018-10-19 MED ORDER — LEVOTHYROXINE 100 MCG TABLET
ORAL_TABLET | Freq: Every day | ORAL | 1 refills | 90 days | Status: CP
Start: 2018-10-19 — End: 2019-10-19

## 2018-10-19 MED ORDER — LOVASTATIN 40 MG TABLET
ORAL_TABLET | Freq: Every evening | ORAL | 1 refills | 90 days | Status: CP
Start: 2018-10-19 — End: 2019-10-19

## 2018-10-22 NOTE — Unmapped (Signed)
Patient states he has 3 weeks of Xtandi on-hand and would like to be called in 2 weeks to schedule delivery

## 2018-10-28 ENCOUNTER — Encounter: Admit: 2018-10-28 | Discharge: 2018-10-29 | Payer: MEDICARE

## 2018-11-03 NOTE — Unmapped (Signed)
[facility] Refill Coordination Note    Specialty Medication(s) to be Shipped:   Hematology/Oncology: Michael [name]    Other medication(s) to be shipped: n/a     Michael [name], DOB: 07-14-1935  Phone: [phone] (home)       All above HIPAA information was verified with patient.     Completed refill call assessment today to schedule patient's medication shipment from the [facility] [phone]).       Specialty medication(s) and dose(s) confirmed: Regimen is correct and unchanged.   Changes to medications: Michael [name] reports no changes at this time.  Changes to insurance: No  Questions for the pharmacist: No    Confirmed patient received Welcome Packet with first shipment. The patient [name] receive a drug information handout for each medication shipped and additional FDA Medication Guides as required.       DISEASE/MEDICATION-SPECIFIC INFORMATION        N/A    SPECIALTY MEDICATION ADHERENCE     Medication Adherence    Patient reported X missed doses in the last month: 0  Specialty Medication: Xtandi  Patient is on additional specialty medications: No  Adherence tools used: medication list   Other adherence tool: routine   Support network for adherence: family member                Xtandi 40 mg: 2 days of medicine on hand       SHIPPING     Shipping address confirmed in Epic.     Delivery Scheduled: Yes, Expected medication delivery date: 11/05/18.     Medication [name] be delivered via Next Day Courier to the home address in Epic [place].    Michael [name]  Anders Grant   [facility] Specialty Pharmacist

## 2018-11-04 MED FILL — XTANDI 40 MG CAPSULE: 30 days supply | Qty: 90 | Fill #10

## 2018-11-04 MED FILL — XTANDI 40 MG CAPSULE: 30 days supply | Qty: 90 | Fill #10 | Status: AC

## 2018-11-10 MED ORDER — GABAPENTIN 300 MG CAPSULE
ORAL_CAPSULE | Freq: Two times a day (BID) | ORAL | 1 refills | 90 days | Status: CP
Start: 2018-11-10 — End: 2019-11-10

## 2018-11-13 ENCOUNTER — Encounter: Admit: 2018-11-13 | Discharge: 2018-11-14 | Payer: MEDICARE | Attending: Family | Primary: Family

## 2018-11-13 DIAGNOSIS — E785 Hyperlipidemia, unspecified: Principal | ICD-10-CM

## 2018-11-13 DIAGNOSIS — E034 Atrophy of thyroid (acquired): Principal | ICD-10-CM

## 2018-11-13 DIAGNOSIS — I10 Essential (primary) hypertension: Principal | ICD-10-CM
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
## 2018-12-02 NOTE — Unmapped (Signed)
[facility] Refill Coordination Note    Specialty Medication(s) to be Shipped:   Hematology/Oncology: Michael [name]    Other medication(s) to be shipped: N/A     Michael [name], DOB: [birth year]  Phone: [phone] (home)       All above HIPAA information was verified with patient.     Completed refill call assessment today to schedule patient's medication shipment from the [facility] [phone]).       Specialty medication(s) and dose(s) confirmed: Regimen is correct and unchanged.   Changes to medications: Jrake reports no changes at this time.  Changes to insurance: No  Questions for the pharmacist: No    Confirmed patient received Welcome Packet with first shipment. The patient will receive a drug information handout for each medication shipped and additional FDA Medication Guides as required.       DISEASE/MEDICATION-SPECIFIC INFORMATION        N/A    SPECIALTY MEDICATION ADHERENCE     Medication Adherence    Patient reported X missed doses in the last month: 0  Specialty Medication: xtandi 40mg   Patient is on additional specialty medications: No  Informant: patient  Adherence tools used: medication list   Other adherence tool: routine   Support network for adherence: family member                Xtandi 40 mg: 3 days of medicine on hand         SHIPPING     Shipping address confirmed in Epic.     Delivery Scheduled: Yes, Expected medication delivery date: 12/05/18.     Medication will be delivered via Next Day Courier to the prescription address in Epic [place].    [name]   [facility] Specialty Technician

## 2018-12-05 MED FILL — XTANDI 40 MG CAPSULE: 30 days supply | Qty: 90 | Fill #11

## 2018-12-05 MED FILL — XTANDI 40 MG CAPSULE: 30 days supply | Qty: 90 | Fill #11 | Status: AC

## 2018-12-22 DIAGNOSIS — C61 Malignant neoplasm of prostate: Principal | ICD-10-CM

## 2018-12-22 NOTE — Unmapped (Signed)
Hi,     Adithya contacted the Communication Center requesting to speak with the care team of Michael [name] to discuss:    Patient called to let Dr. Philomena Course know that his appointment needs to be by phone and not video.      Please contact [name] at [phone].        Check Indicates criteria has been reviewed and confirmed with the patient:    [x]  Preferred Name   [x]  DOB and/or MR#  [x]  Preferred Contact Method  [x]  Phone Number(s)   []  MyChart     Thank you,   Jacques Navy  [facility]   [phone]

## 2018-12-23 ENCOUNTER — Encounter
Admit: 2018-12-23 | Discharge: 2018-12-23 | Payer: MEDICARE | Attending: Hematology & Oncology | Primary: Hematology & Oncology

## 2018-12-23 ENCOUNTER — Encounter: Admit: 2018-12-23 | Discharge: 2018-12-23 | Payer: MEDICARE

## 2018-12-23 DIAGNOSIS — C7951 Secondary malignant neoplasm of bone: Principal | ICD-10-CM

## 2018-12-23 DIAGNOSIS — C61 Malignant neoplasm of prostate: Principal | ICD-10-CM

## 2018-12-23 LAB — CALCIUM: Calcium:MCnc:Pt:Ser/Plas:Qn:: 9.5

## 2018-12-23 LAB — ALBUMIN: Albumin:MCnc:Pt:Ser/Plas:Qn:: 3.9

## 2018-12-23 LAB — CREATININE
CREATININE: 0.65 mg/dL — ABNORMAL LOW (ref 0.70–1.30)
Creatinine:MCnc:Pt:Ser/Plas:Qn:: 0.65 — ABNORMAL LOW
EGFR CKD-EPI AA MALE: >90 mL/min/{1.73_m2} (ref >=60)

## 2018-12-23 LAB — PSA: PROSTATE SPECIFIC ANTIGEN: 0.1 ng/mL (ref 0.00–4.00)

## 2018-12-23 LAB — PROSTATE SPECIFIC ANTIGEN: Prostate specific Ag:MCnc:Pt:Ser/Plas:Qn:: 0.1

## 2018-12-23 LAB — PHOSPHORUS: Phosphate:MCnc:Pt:Ser/Plas:Qn:: 3.4

## 2018-12-23 MED ADMIN — leuprolide (6 month) (ELIGARD) injection 45 mg: 45 mg | SUBCUTANEOUS | @ 17:00:00 | Stop: 2018-12-23

## 2018-12-23 MED ADMIN — denosumab (XGEVA) injection 120 mg: 120 mg | SUBCUTANEOUS | @ 17:00:00 | Stop: 2018-12-23

## 2018-12-23 NOTE — Unmapped (Signed)
Lab Results   Component Value Date    PSA 0.10 12/23/2018    PSA <0.10 10/09/2018    PSA <0.10 07/29/2018    PSA <0.10 07/24/2018    PSA <0.10 06/19/2018    PSA 0.31 04/29/2018     No change in your medication.    Please call [phone] to reach my nurse navigator Mauricia Area for any issues.    For emergencies on Nights, Weekends and Holidays  Call [phone] and ask for the hematology/oncology on call.    Griffin Basil, MD, PhD  Associate Professor of Medicine  Division of Hematology-Oncology    [facility]  Genitourinary Oncology Clinic  Nurse Navigator: Mauricia Area  Fax: [phone]

## 2018-12-23 NOTE — Unmapped (Signed)
Labs completed via phlebotomy/lab.     Calcium 9.5 and Phosphorus 3.4.     Pt received Xgeva and Eligard injections. Xgeva given in right upper arm. Eligard given in right lower abdomen. Pt tolerated it well.    AVS received at scheduling.    Pt stable and ambulated independently from clinic via walker.
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
## 2018-12-23 NOTE — Unmapped (Signed)
Provider on site   meds reviewed

## 2018-12-24 DIAGNOSIS — C61 Malignant neoplasm of prostate: Principal | ICD-10-CM

## 2018-12-24 MED ORDER — XTANDI 40 MG CAPSULE
Freq: Every day | ORAL | 11 refills | 30.00000 days | Status: CP
Start: 2018-12-24 — End: ?
  Filled 2019-01-01: qty 90, 30d supply, fill #0

## 2018-12-24 NOTE — Unmapped (Signed)
Patient Michael [name] was called in attempt number one to reach them regarding scheduling their upcoming appointments.     The call resulted in: Unable to leave message    Please schedule the patient upon their return call for the following:     Return telephone visit with Dr. Philomena Course on 02/10/19 or 02/12/19.    Thank you,    Estevan Oaks

## 2018-12-24 NOTE — Unmapped (Addendum)
[facility] Refill Coordination Note    Specialty Medication(s) to be Shipped:   Hematology/Oncology: Michael [name]    Other medication(s) to be shipped: n/a     Michael [name], DOB: 05/29/1935  Phone: [phone] (home)       All above HIPAA information was verified with patient.     Was a Nurse, learning disability used for this call? No    Completed refill call assessment today to schedule patient's medication shipment from the [facility] [phone]).       Specialty medication(s) and dose(s) confirmed: Regimen is correct and unchanged.   Changes to medications: Received Xgeva and Eligard injections 12/23/18  Changes to insurance: No  Questions for the pharmacist: No    Confirmed patient received Welcome Packet with first shipment. The patient will receive a drug information handout for each medication shipped and additional FDA Medication Guides as required.       DISEASE/MEDICATION-SPECIFIC INFORMATION        N/A    SPECIALTY MEDICATION ADHERENCE     Medication Adherence    Patient reported X missed doses in the last month: 0  Specialty Medication: Xtandi 40mg   Patient is on additional specialty medications: No  Informant: patient  Adherence tools used: medication list   Other adherence tool: routine   Support network for adherence: family member                Xtandi 40 mg: 12 days of medicine on hand         SHIPPING     Shipping address confirmed in Epic.     Delivery Scheduled: Yes, Expected medication delivery date: 01/02/19.  However, Rx request for refills was sent to the provider as there are none remaining.     Medication will be delivered via Next Day Courier to the prescription address in Epic [place].    Michael [name] Michael [name]   [facility] Specialty Technician

## 2019-01-01 MED FILL — XTANDI 40 MG CAPSULE: 30 days supply | Qty: 90 | Fill #0 | Status: AC

## 2019-01-23 DIAGNOSIS — C7951 Secondary malignant neoplasm of bone: Principal | ICD-10-CM

## 2019-01-23 DIAGNOSIS — C61 Malignant neoplasm of prostate: Principal | ICD-10-CM

## 2019-01-23 NOTE — Unmapped (Signed)
[facility] Clinical Assessment & Refill Coordination Note    Michael [name], DOB: 09/07/1935  Phone: [phone] (home)     All above HIPAA information was verified with patient.     Was a Nurse, learning disability used for this call? No    Specialty Medication(s):   Hematology/Oncology: Michael [name]     Current Outpatient Medications   Medication Sig Dispense Refill   ??? acetaminophen (TYLENOL) 325 MG tablet Take 650 mg by mouth two (2) times a day.      ??? aspirin (ECOTRIN) 81 MG tablet Take 81 mg by mouth daily.     ??? CALCIUM CARBONATE/VITAMIN D3 (CALCIUM 600 WITH VITAMIN D3 ORAL) Take 1 tablet by mouth Two (2) times a day.      ??? chlorthalidone (HYGROTON) 25 MG tablet Take 0.5 tablets (12.5 mg total) by mouth daily. 90 tablet 3   ??? CHOLECALCIFEROL, VITAMIN D3, (VITAMIN D3 ORAL) Take 1 capsule by mouth once daily. Unsure of dose     ??? enzalutamide (XTANDI) 40 mg capsule Take 3 capsules (120 mg total) by mouth daily. 90 each 11   ??? gabapentin (NEURONTIN) 300 MG capsule Take 1 capsule (300 mg total) by mouth Two (2) times a day. 180 capsule 1   ??? levothyroxine (EUTHYROX) 100 MCG tablet Take 1 tablet (100 mcg total) by mouth daily. 90 tablet 1   ??? lovastatin (MEVACOR) 40 MG tablet Take 1 tablet (40 mg total) by mouth every evening. 90 tablet 1   ??? vitamin B comp and C no.3 15-10-50-5-300 mg TbER Take 1 tablet by mouth once daily.        No current facility-administered medications for this visit.         Changes to medications: Michael [name] reports no changes at this time.    No Known Allergies    Changes to allergies: No    SPECIALTY MEDICATION ADHERENCE     Xtandi 40 mg: 10 days of medicine on hand     Medication Adherence    Patient reported X missed doses in the last month: 0  Specialty Medication: Xtandi 40 mg  Informant: patient  Adherence tools used: medication list   Other adherence tool: routine   Support network for adherence: family member Confirmed plan for next specialty medication refill: delivery by pharmacy          Specialty medication(s) dose(s) confirmed: Regimen is correct and unchanged.     Are there any concerns with adherence? No    Adherence counseling provided? Not needed    CLINICAL MANAGEMENT AND INTERVENTION      Clinical Benefit Assessment:    Do you feel the medicine is effective or helping your condition? Yes    Clinical Benefit counseling provided? Not needed    Adverse Effects Assessment:    Are you experiencing any side effects? No    Are you experiencing difficulty administering your medicine? No    Quality of Life Assessment:    How many days over the past month did your prostate cancer  keep you from your normal activities? For example, brushing your teeth or getting up in the morning. 0    Have you discussed this with your provider? Not needed    Therapy Appropriateness:    Is therapy appropriate? Yes, therapy is appropriate and should be continued    DISEASE/MEDICATION-SPECIFIC INFORMATION      N/A    PATIENT SPECIFIC NEEDS     ? Does the patient have any  physical, cognitive, or cultural barriers? No    ? Is the patient high risk? Yes, patient is taking oral chemotherapy. Appropriateness of therapy as been assessed.     ? Does the patient require a Care Management Plan? No     ? Does the patient require physician intervention or other additional services (i.e. nutrition, smoking cessation, social work)? No      SHIPPING     Specialty Medication(s) to be Shipped:   Hematology/Oncology: Michael [name]    Other medication(s) to be shipped: none     Changes to insurance: No    Delivery Scheduled: Yes, Expected medication delivery date: 01/30/19.     Medication will be delivered via Next Day Courier to the confirmed prescription address in [facility]. The patient will receive a drug information handout for each medication shipped and additional FDA Medication Guides as required.  Verified that patient has previously received a Conservation officer, historic buildings.    All of the patient's questions and concerns have been addressed.    Michael [name] [facility] Specialty Pharmacist

## 2019-01-29 MED FILL — XTANDI 40 MG CAPSULE: ORAL | 30 days supply | Qty: 90 | Fill #1

## 2019-01-29 MED FILL — XTANDI 40 MG CAPSULE: 30 days supply | Qty: 90 | Fill #1 | Status: AC

## 2019-02-03 ENCOUNTER — Ambulatory Visit: Admit: 2019-02-03 | Discharge: 2019-02-03 | Payer: MEDICARE

## 2019-02-03 ENCOUNTER — Encounter: Admit: 2019-02-03 | Discharge: 2019-02-03 | Payer: MEDICARE

## 2019-02-03 DIAGNOSIS — C61 Malignant neoplasm of prostate: Principal | ICD-10-CM

## 2019-02-03 DIAGNOSIS — C7951 Secondary malignant neoplasm of bone: Secondary | ICD-10-CM

## 2019-02-03 LAB — CALCIUM: Calcium:MCnc:Pt:Ser/Plas:Qn:: 8.6

## 2019-02-03 LAB — PHOSPHORUS: Phosphate:MCnc:Pt:Ser/Plas:Qn:: 2.9

## 2019-02-03 LAB — PROSTATE SPECIFIC ANTIGEN: Prostate specific Ag:MCnc:Pt:Ser/Plas:Qn:: 0.14

## 2019-02-03 LAB — CREATININE: EGFR CKD-EPI NON-AA MALE: >90 mL/min/{1.73_m2} (ref >=60)

## 2019-02-03 LAB — EGFR CKD-EPI AA MALE: Glomerular filtration rate/1.73 sq M.predicted.black:ArVRat:Pt:Ser/Plas/Bld:Qn:Creatinine-based formula (CKD-EPI): >90

## 2019-02-03 LAB — ALBUMIN: Albumin:MCnc:Pt:Ser/Plas:Qn:: 3.6

## 2019-02-03 NOTE — Unmapped (Signed)
Labs completed via phlebotomy/lab.     Calcium 8.6 and Phosphorus 2.9.     Pt received Xgeva injection in the right upper arm. Pt tolerated it well.    AVS received at scheduling.    Pt stable and ambulated independently from clinic via walker.

## 2019-02-04 NOTE — Unmapped (Signed)
I spoke with spouse of patient Michael [name] to confirm appointments on the following date(s): 02/10/19 at 12:00pm.    Estevan Oaks

## 2019-02-10 ENCOUNTER — Institutional Professional Consult (permissible substitution)
Admit: 2019-02-10 | Discharge: 2019-02-11 | Payer: MEDICARE | Attending: Hematology & Oncology | Primary: Hematology & Oncology

## 2019-02-10 DIAGNOSIS — C775 Secondary and unspecified malignant neoplasm of intrapelvic lymph nodes: Principal | ICD-10-CM

## 2019-02-10 DIAGNOSIS — C61 Malignant neoplasm of prostate: Principal | ICD-10-CM

## 2019-02-10 DIAGNOSIS — C7951 Secondary malignant neoplasm of bone: Principal | ICD-10-CM
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
## 2019-02-10 NOTE — Unmapped (Signed)
Reviewed Meds and Allergies, confirmed pharmacy and informed how to use Doximity. Visit complete and provider is onsite.

## 2019-02-10 NOTE — Unmapped (Signed)
Lab Results   Component Value Date    PSA 0.14 02/03/2019    PSA 0.10 12/23/2018    PSA <0.10 10/09/2018    PSA <0.10 07/29/2018    PSA <0.10 07/24/2018    PSA <0.10 06/19/2018     Return in 2 months, for lab and injection.      I would strongly recommend that you receive one of the approved COVID-19 vaccines, as soon as you are eligible to receive it.    Please check the [facility] website [email or web address] (which takes you to [email or web address] ) for latest information on how to schedule your injection (though it may still take a few weeks for your group.).    There is now sign-up available for Covid vaccination.  See here:  [email or web address].[email or web address].[email or web address]  You could also call call [phone].    Please call [phone] to reach my nurse navigator Mauricia Area for any issues.    For emergencies on Nights, Weekends and Holidays  Call [phone] and ask for the hematology/oncology on call.    Griffin Basil, MD, PhD  Associate Professor of Medicine  Division of Hematology-Oncology    [facility]  Genitourinary Oncology Clinic  Nurse Navigator: Mauricia Area  Fax: [phone]

## 2019-02-10 NOTE — Unmapped (Signed)
I spoke with patient Michael [name] to confirm appointments on the following date(s): 04/10/19 and 04/14/19    Kaleen Odea
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
## 2019-02-20 ENCOUNTER — Encounter: Admit: 2019-02-20 | Discharge: 2019-02-21 | Payer: MEDICARE | Attending: Family | Primary: Family

## 2019-02-20 DIAGNOSIS — L03115 Cellulitis of right lower limb: Principal | ICD-10-CM

## 2019-02-20 DIAGNOSIS — M79604 Pain in right leg: Principal | ICD-10-CM

## 2019-02-20 DIAGNOSIS — M7989 Other specified soft tissue disorders: Secondary | ICD-10-CM

## 2019-02-20 DIAGNOSIS — L97412 Non-pressure chronic ulcer of right heel and midfoot with fat layer exposed: Principal | ICD-10-CM

## 2019-02-20 MED ORDER — HYDROCOLLOID DRESSING 2" X 2"
0 refills | 0 days | Status: CP
Start: 2019-02-20 — End: ?

## 2019-02-20 MED ORDER — CLINDAMYCIN HCL 300 MG CAPSULE
ORAL_CAPSULE | Freq: Four times a day (QID) | ORAL | 0 refills | 10 days | Status: CP
Start: 2019-02-20 — End: 2019-03-02

## 2019-02-20 NOTE — Unmapped (Signed)
Patient Education        Cellulitis: Care Instructions  Your Care Instructions     Cellulitis is a skin infection caused by bacteria, most often strep or staph. It often occurs after a break in the skin from a scrape, cut, bite, or puncture, or after a rash.  Cellulitis may be treated without doing tests to find out what caused it. But your doctor may do tests, if needed, to look for a specific bacteria, like methicillin-resistant Staphylococcus aureus (MRSA).  The doctor has checked you carefully, but problems can develop later. If you notice any problems or new symptoms, get medical treatment right away.  Follow-up care is a key part of your treatment and safety. Be sure to make and go to all appointments, and call your doctor if you are having problems. It's also a good idea to know your test results and keep a list of the medicines you take.  How can you care for yourself at home?  ?? Take your antibiotics as directed. Do not stop taking them just because you feel better. You need to take the full course of antibiotics.  ?? Prop up the infected area on pillows to reduce pain and swelling. Try to keep the area above the level of your heart as often as you can.  ?? If your doctor told you how to care for your wound, follow your doctor's instructions. If you did not get instructions, follow this general advice:  ? Wash the wound with clean water 2 times a day. Don't use hydrogen peroxide or alcohol, which can slow healing.  ? You may cover the wound with a thin layer of petroleum jelly, such as Vaseline, and a nonstick bandage.  ? Apply more petroleum jelly and replace the bandage as needed.  ?? Be safe with medicines. Take pain medicines exactly as directed.  ? If the doctor gave you a prescription medicine for pain, take it as prescribed.  ? If you are not taking a prescription pain medicine, ask your doctor if you can take an over-the-counter medicine.  To prevent cellulitis in the future ?? Try to prevent cuts, scrapes, or other injuries to your skin. Cellulitis most often occurs where there is a break in the skin.  ?? If you get a scrape, cut, mild burn, or bite, wash the wound with clean water as soon as you can to help avoid infection. Don't use hydrogen peroxide or alcohol, which can slow healing.  ?? If you have swelling in your legs (edema), support stockings and good skin care may help prevent leg sores and cellulitis.  ?? Take care of your feet, especially if you have diabetes or other conditions that increase the risk of infection. Wear shoes and socks. Do not go barefoot. If you have athlete's foot or other skin problems on your feet, talk to your doctor about how to treat them.  When should you call for help?   Call your doctor now or seek immediate medical care if:  ?? ?? You have signs that your infection is getting worse, such as:  ? Increased pain, swelling, warmth, or redness.  ? Red streaks leading from the area.  ? Pus draining from the area.  ? A fever.   ?? ?? You get a rash.   Watch closely for changes in your health, and be sure to contact your doctor if:  ?? ?? You do not get better as expected.   Where can you learn more?  Go to [facility] at https://myuncchart.org  Select Patient Education under [employer]. Enter [phone] in the search box to learn more about Cellulitis: Care Instructions.  Current as of: July 17, 2018??????????????????????????????Content Version: 12.7  ?? 2006-2020 [employer].   Care instructions adapted under license by [facility]. If you have questions about a medical condition or this instruction, always ask your healthcare professional. [employer], Inc[employer] disclaims any warranty or liability for your use of this information.

## 2019-02-23 ENCOUNTER — Ambulatory Visit: Admit: 2019-02-23 | Discharge: 2019-02-24 | Payer: MEDICARE

## 2019-03-03 NOTE — Unmapped (Signed)
[facility] Refill Coordination Note    Specialty Medication(s) to be Shipped:   Hematology/Oncology: Michael [name]    Other medication(s) to be shipped:       Michael [name], DOB: Aug 27, 1935  Phone: [phone] (home)       All above HIPAA information was verified with patient's family member, Michael [name].     Was a Nurse, learning disability used for this call? No    Completed refill call assessment today to schedule patient's medication shipment from the [facility] [phone]).       Specialty medication(s) and dose(s) confirmed: Regimen is correct and unchanged.   Changes to medications: Michael [name] reports no changes at this time.  Changes to insurance: No  Questions for the pharmacist: No    Confirmed patient received Welcome Packet with first shipment. The patient will receive a drug information handout for each medication shipped and additional FDA Medication Guides as required.       DISEASE/MEDICATION-SPECIFIC INFORMATION        N/A    SPECIALTY MEDICATION ADHERENCE     Medication Adherence    Patient reported X missed doses in the last month: 0  Specialty Medication: Xtandi 40mg   Informant: spouse  Reliability of informant: reliable  Adherence tools used: medication list   Other adherence tool: routine   Support network for adherence: family member                Xtandi 40 mg: 7 days of medicine on hand         SHIPPING     Shipping address confirmed in Epic.     Delivery Scheduled: Yes, Expected medication delivery date: 02/19.     Medication will be delivered via Next Day Courier to the prescription address in Epic WAM.    Antonietta Barcelona   [facility] Specialty Technician

## 2019-03-05 MED FILL — XTANDI 40 MG CAPSULE: ORAL | 30 days supply | Qty: 90 | Fill #2

## 2019-03-05 MED FILL — XTANDI 40 MG CAPSULE: 30 days supply | Qty: 90 | Fill #2 | Status: AC
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
## 2019-03-13 ENCOUNTER — Ambulatory Visit: Admit: 2019-03-13 | Discharge: 2019-03-14 | Payer: MEDICARE | Attending: Family | Primary: Family

## 2019-03-13 DIAGNOSIS — Z79899 Other long term (current) drug therapy: Principal | ICD-10-CM

## 2019-03-13 DIAGNOSIS — L97411 Non-pressure chronic ulcer of right heel and midfoot limited to breakdown of skin: Principal | ICD-10-CM

## 2019-03-13 DIAGNOSIS — I10 Essential (primary) hypertension: Principal | ICD-10-CM

## 2019-03-13 DIAGNOSIS — Z5181 Encounter for therapeutic drug level monitoring: Secondary | ICD-10-CM

## 2019-03-13 DIAGNOSIS — R600 Localized edema: Principal | ICD-10-CM

## 2019-03-13 LAB — POTASSIUM: Potassium:SCnc:Pt:Ser/Plas:Qn:: 4.1

## 2019-03-13 MED ORDER — FUROSEMIDE 20 MG TABLET
ORAL_TABLET | Freq: Every day | ORAL | 0 refills | 10.00000 days | Status: CP
Start: 2019-03-13 — End: 2020-03-12

## 2019-04-02 NOTE — Unmapped (Signed)
[facility] Refill Coordination Note    Specialty Medication(s) to be Shipped:   Hematology/Oncology: [name]    Other medication(s) to be shipped: n/a     Michael [name], DOB: [birth year]  Phone: [phone] (home)       All above HIPAA information was verified with patient.     Was a Nurse, learning disability used for this call? No    Completed refill call assessment today to schedule patient's medication shipment from the [facility] [phone]).       Specialty medication(s) and dose(s) confirmed: Regimen is correct and unchanged.   Changes to medications: [name] reports no changes at this time.  Changes to insurance: No  Questions for the pharmacist: No    Confirmed patient received Welcome Packet with first shipment. The patient will receive a drug information handout for each medication shipped and additional FDA Medication Guides as required.       DISEASE/MEDICATION-SPECIFIC INFORMATION        N/A    SPECIALTY MEDICATION ADHERENCE     Medication Adherence    Patient reported X missed doses in the last month: 0  Specialty Medication: Xtandi 40mg   Patient is on additional specialty medications: No  Informant: patient  Adherence tools used: medication list   Other adherence tool: routine   Support network for adherence: family member                Xtandi 40 mg: 8 days of medicine on hand         SHIPPING     Shipping address confirmed in Epic.     Delivery Scheduled: Yes, Expected medication delivery date: 04/07/19.     Medication will be delivered via Next Day Courier to the prescription address in Epic [place].    [name]   [facility] Specialty Technician

## 2019-04-06 MED FILL — XTANDI 40 MG CAPSULE: 30 days supply | Qty: 90 | Fill #3 | Status: AC

## 2019-04-06 MED FILL — XTANDI 40 MG CAPSULE: ORAL | 30 days supply | Qty: 90 | Fill #3

## 2019-04-10 ENCOUNTER — Encounter: Admit: 2019-04-10 | Discharge: 2019-04-10 | Payer: MEDICARE

## 2019-04-10 DIAGNOSIS — C7951 Secondary malignant neoplasm of bone: Principal | ICD-10-CM

## 2019-04-10 DIAGNOSIS — C61 Malignant neoplasm of prostate: Principal | ICD-10-CM

## 2019-04-10 LAB — CREATININE
CREATININE: 0.64 mg/dL — ABNORMAL LOW (ref 0.70–1.30)
Creatinine:MCnc:Pt:Ser/Plas:Qn:: 0.64 — ABNORMAL LOW

## 2019-04-10 LAB — CALCIUM: Calcium:MCnc:Pt:Ser/Plas:Qn:: 9.2

## 2019-04-10 LAB — PROSTATE SPECIFIC ANTIGEN: Prostate specific Ag:MCnc:Pt:Ser/Plas:Qn:: 0.25

## 2019-04-10 LAB — PHOSPHORUS: Phosphate:MCnc:Pt:Ser/Plas:Qn:: 2.8 — ABNORMAL LOW

## 2019-04-10 LAB — ALBUMIN: Albumin:MCnc:Pt:Ser/Plas:Qn:: 3.8

## 2019-04-10 NOTE — Unmapped (Signed)
[name] presents to clinic for Xgeva injection. Ca and Phos are within parameters for injection per therapy plan. Xgeva administered as directed. Pt tolerated injection well. [name] has future appts scheduled and has no further needs or concerns today. He is ambulatory from clinic via walker.

## 2019-04-14 ENCOUNTER — Encounter
Admit: 2019-04-14 | Discharge: 2019-04-15 | Payer: MEDICARE | Attending: Hematology & Oncology | Primary: Hematology & Oncology

## 2019-04-14 DIAGNOSIS — C775 Secondary and unspecified malignant neoplasm of intrapelvic lymph nodes: Principal | ICD-10-CM

## 2019-04-14 DIAGNOSIS — C61 Malignant neoplasm of prostate: Principal | ICD-10-CM

## 2019-04-14 DIAGNOSIS — C7951 Secondary malignant neoplasm of bone: Principal | ICD-10-CM

## 2019-04-14 MED ORDER — LEVOTHYROXINE 100 MCG TABLET
ORAL_TABLET | Freq: Every day | ORAL | 0 refills | 90 days | Status: CP
Start: 2019-04-14 — End: 2020-04-13

## 2019-04-14 NOTE — Unmapped (Signed)
Called patient to perform Nursing assessment and pre-video/phone visit.  Reviewed allergies, medications, medical and surgical history.  Completed outstanding screenings.  Patient verbalized time and date of upcoming visit.  No further questions or concerns voiced. Pt to have phone visit with Dr. Philomena Course at 8am.

## 2019-04-14 NOTE — Unmapped (Signed)
Lab Results   Component Value Date    PSA 0.25 04/10/2019    PSA 0.14 02/03/2019    PSA 0.10 12/23/2018    PSA <0.10 10/09/2018    PSA <0.10 07/29/2018    PSA <0.10 07/24/2018   Continue with current treatment. We'll see you in my [place] clinic in June.    Please call [phone] to reach my nurse navigator Mauricia Area for any issues.    For emergencies on Nights, Weekends and Holidays  Call [phone] and ask for the hematology/oncology on call.    Griffin Basil, MD, PhD  Associate Professor of Medicine  Division of Hematology-Oncology    [facility]  Genit[facility] Oncology [facility]  Nurse Navigator: Mauricia Area  Fax: [phone]

## 2019-04-14 NOTE — Unmapped (Signed)
I spoke with patient Michael [name] to confirm appointments on the following date(s): 06/23/19 for labs , follow up with whang and injection.     Christine Lyn Hollingshead
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
## 2019-04-20 MED ORDER — LOVASTATIN 40 MG TABLET
ORAL_TABLET | Freq: Every evening | ORAL | 1 refills | 90 days | Status: CP
Start: 2019-04-20 — End: 2020-04-19

## 2019-05-01 NOTE — Unmapped (Signed)
[facility] Refill Coordination Note    Specialty Medication(s) to be Shipped:   Hematology/Oncology: Michael [name]    Other medication(s) to be shipped: n/a     Michael [name], DOB: 09-15-1935  Phone: [phone] (home)       All above HIPAA information was verified with patient's family member, Michael [name].     Was a Nurse, learning disability used for this call? No    Completed refill call assessment today to schedule patient's medication shipment from the [facility] [phone]).       Specialty medication(s) and dose(s) confirmed: Regimen is correct and unchanged.   Changes to medications: Michael [name] reports no changes at this time.  Changes to insurance: No  Questions for the pharmacist: No    Confirmed patient received Welcome Packet with first shipment. The patient will receive a drug information handout for each medication shipped and additional FDA Medication Guides as required.       DISEASE/MEDICATION-SPECIFIC INFORMATION        N/A    SPECIALTY MEDICATION ADHERENCE     Medication Adherence    Patient reported X missed doses in the last month: 0  Specialty Medication: Xtandi 40mg   Patient is on additional specialty medications: No  Informant: spouse  Adherence tools used: medication list   Other adherence tool: routine   Support network for adherence: family member                Xtandi 40 mg: 5 days of medicine on hand         SHIPPING     Shipping address confirmed in Epic.     Delivery Scheduled: Yes, Expected medication delivery date: 05/04/19.     Medication will be delivered via Same Day Courier to the prescription address in Epic [place].    Michael [name]   [facility] Specialty Technician

## 2019-05-04 MED FILL — XTANDI 40 MG CAPSULE: ORAL | 30 days supply | Qty: 90 | Fill #4

## 2019-05-04 MED FILL — XTANDI 40 MG CAPSULE: 30 days supply | Qty: 90 | Fill #4 | Status: AC

## 2019-05-24 MED ORDER — GABAPENTIN 300 MG CAPSULE
ORAL_CAPSULE | Freq: Two times a day (BID) | ORAL | 1 refills | 90.00000 days | Status: CP
Start: 2019-05-24 — End: 2020-05-23

## 2019-05-28 NOTE — Unmapped (Signed)
[facility] Refill Coordination Note    Specialty Medication(s) to be Shipped:   Hematology/Oncology: Michael [name]    Other medication(s) to be shipped: N/A     Michael [name], DOB: June 19, 1935  Phone: [phone] (home)       All above HIPAA information was verified with patient's family member, Spouse.     Was a Nurse, learning disability used for this call? No    Completed refill call assessment today to schedule patient's medication shipment from the [facility] [phone]).       Specialty medication(s) and dose(s) confirmed: Regimen is correct and unchanged.   Changes to medications: Kristopher reports no changes at this time.  Changes to insurance: No  Questions for the pharmacist: No    Confirmed patient received Welcome Packet with first shipment. The patient will receive a drug information handout for each medication shipped and additional FDA Medication Guides as required.       DISEASE/MEDICATION-SPECIFIC INFORMATION        N/A    SPECIALTY MEDICATION ADHERENCE     Medication Adherence    Patient reported X missed doses in the last month: 0  Specialty Medication: Xtandi 40mg   Patient is on additional specialty medications: No  Informant: spouse  Adherence tools used: medication list   Other adherence tool: routine   Support network for adherence: family member                Xtandi 40 mg: 10 days of medicine on hand         SHIPPING     Shipping address confirmed in Epic.     Delivery Scheduled: Yes, Expected medication delivery date: 06/03/19.     Medication will be delivered via Same Day Courier to the prescription address in [employer] [place].    Michael [name]   [facility] Specialty Technician

## 2019-06-01 DIAGNOSIS — I10 Essential (primary) hypertension: Principal | ICD-10-CM

## 2019-06-01 MED ORDER — CHLORTHALIDONE 25 MG TABLET: 25 mg | tablet | Freq: Every day | 0 refills | 45 days | Status: AC

## 2019-06-01 MED ORDER — CHLORTHALIDONE 25 MG TABLET
ORAL_TABLET | Freq: Every day | ORAL | 0 refills | 0.00000 days | Status: CP
Start: 2019-06-01 — End: 2019-11-09

## 2019-06-01 NOTE — Unmapped (Signed)
P/C requesting refill on chlordthalidone , last ordered on 03/14/2018, last visit 03/13/19.

## 2019-06-03 DIAGNOSIS — I10 Essential (primary) hypertension: Principal | ICD-10-CM

## 2019-06-03 MED ORDER — CHLORTHALIDONE 25 MG TABLET
ORAL_TABLET | 0 refills | 0 days
Start: 2019-06-03 — End: ?

## 2019-06-03 MED FILL — XTANDI 40 MG CAPSULE: 30 days supply | Qty: 90 | Fill #5 | Status: AC

## 2019-06-03 MED FILL — XTANDI 40 MG CAPSULE: ORAL | 30 days supply | Qty: 90 | Fill #5
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
## 2019-06-10 ENCOUNTER — Ambulatory Visit: Admit: 2019-06-10 | Payer: MEDICARE | Attending: Family | Primary: Family
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
## 2019-06-23 ENCOUNTER — Institutional Professional Consult (permissible substitution): Admit: 2019-06-23 | Discharge: 2019-06-23 | Payer: MEDICARE

## 2019-06-23 ENCOUNTER — Ambulatory Visit
Admit: 2019-06-23 | Discharge: 2019-06-24 | Payer: MEDICARE | Attending: Hematology & Oncology | Primary: Hematology & Oncology

## 2019-06-23 ENCOUNTER — Other Ambulatory Visit: Admit: 2019-06-23 | Discharge: 2019-06-23 | Payer: MEDICARE

## 2019-06-23 DIAGNOSIS — C61 Malignant neoplasm of prostate: Principal | ICD-10-CM

## 2019-06-23 DIAGNOSIS — C7951 Secondary malignant neoplasm of bone: Principal | ICD-10-CM

## 2019-06-23 LAB — CALCIUM: Calcium:MCnc:Pt:Ser/Plas:Qn:: 9.1

## 2019-06-23 LAB — PHOSPHORUS: Phosphate:MCnc:Pt:Ser/Plas:Qn:: 3.4

## 2019-06-23 LAB — CREATININE: CREATININE: 0.59 mg/dL — ABNORMAL LOW (ref 0.70–1.30)

## 2019-06-23 LAB — ALBUMIN: Albumin:MCnc:Pt:Ser/Plas:Qn:: 3.8

## 2019-06-23 LAB — EGFR CKD-EPI NON-AA MALE
Glomerular filtration rate/1.73 sq M.predicted.non black:ArVRat:Pt:Ser/Plas/Bld:Qn:Creatinine-based formula (CKD-EPI): >90

## 2019-06-23 LAB — PROSTATE SPECIFIC ANTIGEN: Prostate specific Ag:MCnc:Pt:Ser/Plas:Qn:: 0.4

## 2019-06-23 MED ADMIN — leuprolide (6 month) (ELIGARD) injection 45 mg: 45 mg | SUBCUTANEOUS | @ 15:00:00 | Stop: 2019-06-23

## 2019-06-23 MED ADMIN — denosumab (XGEVA) injection 120 mg: 120 mg | SUBCUTANEOUS | @ 15:00:00 | Stop: 2019-06-23

## 2019-06-23 NOTE — Unmapped (Unsigned)
Labs drawn peripherally by [facility].

## 2019-06-23 NOTE — Unmapped (Addendum)
Lab Results   Component Value Date    PSA 0.25 04/10/2019    PSA 0.14 02/03/2019    PSA 0.10 12/23/2018    PSA <0.10 10/09/2018    PSA <0.10 07/29/2018    PSA <0.10 07/24/2018   Continue with Xtandi. Scans in 3 months.    Please call [phone] to reach my nurse navigator Mauricia Area for any issues.    For emergencies on Nights, Weekends and Holidays  Call [phone] and ask for the hematology/oncology on call.    Griffin Basil, MD, PhD  Associate Professor of Medicine  Division of Hematology-Oncology    [facility]  Genitourinary Oncology Clinic  Nurse Navigator: Mauricia Area  Fax: [phone]

## 2019-06-23 NOTE — Unmapped (Signed)
Injections given per orders.

## 2019-06-23 NOTE — Unmapped (Signed)
Michael [name] and Michael [name] given per orders without any difficulty note.

## 2019-06-24 NOTE — Unmapped (Signed)
[facility] Clinical Assessment & Refill Coordination Note    [name] denies any side effects/concerns at this time.  He endorses taking Ca+Vit D supplement daily.    Michael [name], DOB: 06-10-1935  Phone: [phone] (home)     All above HIPAA information was verified with patient.     Was a Nurse, learning disability used for this call? No    Specialty Medication(s):   Hematology/Oncology: Michael [name]     Current Outpatient Medications   Medication Sig Dispense Refill   ??? acetaminophen (TYLENOL) 325 MG tablet Take 650 mg by mouth two (2) times a day.      ??? aspirin (ECOTRIN) 81 MG tablet Take 81 mg by mouth daily.     ??? CALCIUM CARBONATE/VITAMIN D3 (CALCIUM 600 WITH VITAMIN D3 ORAL) Take 1 tablet by mouth Two (2) times a day.      ??? chlorthalidone (HYGROTON) 25 MG tablet Take 1 tablet (25 mg total) by mouth daily. 45 tablet 0   ??? CHOLECALCIFEROL, VITAMIN D3, (VITAMIN D3 ORAL) Take 1 capsule by mouth once daily. Unsure of dose     ??? enzalutamide (XTANDI) 40 mg capsule Take 3 capsules (120 mg total) by mouth daily. 90 each 11   ??? furosemide (LASIX) 20 MG tablet Take 1 tablet (20 mg total) by mouth daily. (Patient not taking: Reported on 04/14/2019) 10 tablet 0   ??? gabapentin (NEURONTIN) 300 MG capsule Take 1 capsule (300 mg total) by mouth Two (2) times a day. 180 capsule 1   ??? hydrocolloid dressing 2 X 2  Bndg Apply to wound bed (open ulcer) of right heel every 24-48 hours. 10 each 0   ??? levothyroxine (EUTHYROX) 100 MCG tablet Take 1 tablet (100 mcg total) by mouth daily. 90 tablet 0   ??? lovastatin (MEVACOR) 40 MG tablet Take 1 tablet (40 mg total) by mouth every evening. 90 tablet 1   ??? vitamin B comp and C no.3 15-10-50-5-300 mg TbER Take 1 tablet by mouth once daily.        No current facility-administered medications for this visit.        Changes to medications: Michael [name] reports no changes at this time.    No Known Allergies    Changes to allergies: No    SPECIALTY MEDICATION ADHERENCE     Xtandi 40 mg: 14 days of medicine on hand     Medication Adherence    Patient reported X missed doses in the last month: 0  Specialty Medication: xtandi 40mg  - 3 capsules daily  Patient is on additional specialty medications: No  Informant: patient  Adherence tools used: medication list   Other adherence tool: routine   Support network for adherence: family member  Confirmed plan for next specialty medication refill: delivery by pharmacy          Specialty medication(s) dose(s) confirmed: Regimen is correct and unchanged.     Are there any concerns with adherence? No    Adherence counseling provided? Not needed    CLINICAL MANAGEMENT AND INTERVENTION      Clinical Benefit Assessment:    Do you feel the medicine is effective or helping your condition? Yes    Clinical Benefit counseling provided? Not needed    Adverse Effects Assessment:    Are you experiencing any side effects? No    Are you experiencing difficulty administering your medicine? No    Quality of Life Assessment:    How many days over the past  month did your prostate cancer  keep you from your normal activities? For example, brushing your teeth or getting up in the morning. 0    Have you discussed this with your provider? Not needed    Therapy Appropriateness:    Is therapy appropriate? Yes, therapy is appropriate and should be continued    DISEASE/MEDICATION-SPECIFIC INFORMATION      N/A    PATIENT SPECIFIC NEEDS     - Does the patient have any physical, cognitive, or cultural barriers? No    - Is the patient high risk? Yes, patient is taking oral chemotherapy. Appropriateness of therapy as been assessed.     - Does the patient require a Care Management Plan? No     - Does the patient require physician intervention or other additional services (i.e. nutrition, smoking cessation, social work)? No      SHIPPING     Specialty Medication(s) to be Shipped:   Hematology/Oncology: Michael [name]    Other medication(s) to be shipped: none     Changes to insurance: No Delivery Scheduled: Yes, Expected medication delivery date: 07/02/19.     Medication will be delivered via Next Day Courier to the confirmed prescription address in [facility].    The patient will receive a drug information handout for each medication shipped and additional FDA Medication Guides as required.  Verified that patient has previously received a Conservation officer, historic buildings.    All of the patient's questions and concerns have been addressed.    Michael [name] Shared [facility] Specialty Pharmacist

## 2019-07-01 MED FILL — XTANDI 40 MG CAPSULE: ORAL | 30 days supply | Qty: 90 | Fill #6

## 2019-07-01 MED FILL — XTANDI 40 MG CAPSULE: 30 days supply | Qty: 90 | Fill #6 | Status: AC

## 2019-07-05 ENCOUNTER — Ambulatory Visit
Admit: 2019-07-05 | Discharge: 2019-07-15 | Disposition: A | Discharge status: Skilled Nursing Facility | Payer: MEDICARE | Admitting: Hospitalist

## 2019-07-05 ENCOUNTER — Encounter
Admit: 2019-07-05 | Discharge: 2019-07-15 | Disposition: A | Discharge status: Skilled Nursing Facility | Payer: MEDICARE | Admitting: Hospitalist

## 2019-07-05 LAB — BASIC METABOLIC PANEL
BLOOD UREA NITROGEN: 22 mg/dL — ABNORMAL HIGH (ref 7–21)
BUN / CREAT RATIO: 37
CALCIUM: 8.1 mg/dL — ABNORMAL LOW (ref 8.5–10.2)
CHLORIDE: 107 mmol/L (ref 98–107)
CO2: 23 mmol/L (ref 22.0–30.0)
CREATININE: 0.59 mg/dL — ABNORMAL LOW (ref 0.70–1.30)
EGFR CKD-EPI AA MALE: >90 mL/min/{1.73_m2} (ref >=60)
EGFR CKD-EPI NON-AA MALE: >90 mL/min/{1.73_m2} (ref >=60)
GLUCOSE RANDOM: 114 mg/dL (ref 70–179)
POTASSIUM: 2.8 mmol/L — ABNORMAL LOW (ref 3.5–5.0)

## 2019-07-05 LAB — WBC ADJUSTED: Leukocytes:NCnc:Pt:Bld:Qn:: 5.6

## 2019-07-05 LAB — TROPONIN I
TROPONIN I: 0.228 ng/mL (ref <0.034)
Troponin I.cardiac:MCnc:Pt:Ser/Plas:Qn:: 0.126
Troponin I.cardiac:MCnc:Pt:Ser/Plas:Qn:: 0.228
Troponin I.cardiac:MCnc:Pt:Ser/Plas:Qn:: 0.235
Troponin I.cardiac:MCnc:Pt:Ser/Plas:Qn:: 0.253
Troponin I.cardiac:MCnc:Pt:Ser/Plas:Qn:: 0.298

## 2019-07-05 LAB — CBC W/ AUTO DIFF
BASOPHILS ABSOLUTE COUNT: 0 10*9/L (ref 0.0–0.1)
BASOPHILS RELATIVE PERCENT: 0.1 %
EOSINOPHILS ABSOLUTE COUNT: 0 10*9/L (ref 0.0–0.4)
EOSINOPHILS RELATIVE PERCENT: 0 %
HEMATOCRIT: 33.2 % — ABNORMAL LOW (ref 41.0–53.0)
LARGE UNSTAINED CELLS: 1 % (ref 0–4)
LYMPHOCYTES ABSOLUTE COUNT: 0.4 10*9/L — ABNORMAL LOW (ref 1.5–5.0)
LYMPHOCYTES RELATIVE PERCENT: 4.2 %
MEAN CORPUSCULAR HEMOGLOBIN CONC: 33.4 g/dL (ref 31.0–37.0)
MEAN CORPUSCULAR HEMOGLOBIN: 33 pg (ref 26.0–34.0)
MEAN CORPUSCULAR VOLUME: 98.8 fL (ref 80.0–100.0)
MEAN PLATELET VOLUME: 8.4 fL (ref 7.0–10.0)
MONOCYTES ABSOLUTE COUNT: 0.5 10*9/L (ref 0.2–0.8)
MONOCYTES RELATIVE PERCENT: 5.4 %
NEUTROPHILS ABSOLUTE COUNT: 7.8 10*9/L — ABNORMAL HIGH (ref 2.0–7.5)
PLATELET COUNT: 277 10*9/L (ref 150–440)
RED BLOOD CELL COUNT: 3.36 10*12/L — ABNORMAL LOW (ref 4.50–5.90)
RED CELL DISTRIBUTION WIDTH: 14.8 % (ref 12.0–15.0)
WBC ADJUSTED: 8.7 10*9/L (ref 4.5–11.0)

## 2019-07-05 LAB — TRIGLYCERIDES: Triglyceride:MCnc:Pt:Ser/Plas:Qn:: 69

## 2019-07-05 LAB — PLATELET COUNT: Platelets:NCnc:Pt:Bld:Qn:Automated count: 277

## 2019-07-05 LAB — CBC
HEMATOCRIT: 29.6 % — ABNORMAL LOW (ref 41.0–53.0)
HEMOGLOBIN: 9.6 g/dL — ABNORMAL LOW (ref 13.5–17.5)
MEAN CORPUSCULAR HEMOGLOBIN CONC: 32.4 g/dL (ref 31.0–37.0)
MEAN CORPUSCULAR VOLUME: 99.1 fL (ref 80.0–100.0)
MEAN PLATELET VOLUME: 7.5 fL (ref 7.0–10.0)
PLATELET COUNT: 215 10*9/L (ref 150–440)
RED BLOOD CELL COUNT: 2.99 10*12/L — ABNORMAL LOW (ref 4.50–5.90)
WBC ADJUSTED: 5.6 10*9/L (ref 4.5–11.0)

## 2019-07-05 LAB — URINALYSIS WITH CULTURE REFLEX
BACTERIA: NONE SEEN /HPF
BILIRUBIN UA: NEGATIVE
GLUCOSE UA: NEGATIVE
HYALINE CASTS: 1 /LPF (ref 0–1)
KETONES UA: 20 — AB
LEUKOCYTE ESTERASE UA: NEGATIVE
PH UA: 6 (ref 5.0–9.0)
PROTEIN UA: 30 — AB
RBC UA: >182 /HPF — ABNORMAL HIGH (ref <=3)
SQUAMOUS EPITHELIAL: 2 /HPF (ref 0–5)
UROBILINOGEN UA: 0.2
WBC UA: 7 /HPF — ABNORMAL HIGH (ref <=2)

## 2019-07-05 LAB — BILIRUBIN TOTAL: Bilirubin:MCnc:Pt:Ser/Plas:Qn:: 1.4 — ABNORMAL HIGH

## 2019-07-05 LAB — PHOSPHORUS: Phosphate:MCnc:Pt:Ser/Plas:Qn:: 2.8 — ABNORMAL LOW

## 2019-07-05 LAB — COMPREHENSIVE METABOLIC PANEL
ALBUMIN: 3.5 g/dL (ref 3.5–5.0)
ALKALINE PHOSPHATASE: 55 U/L (ref 38–126)
ALT (SGPT): 12 U/L (ref <50)
ANION GAP: 10 mmol/L (ref 7–15)
BILIRUBIN TOTAL: 1.4 mg/dL — ABNORMAL HIGH (ref 0.0–1.2)
BLOOD UREA NITROGEN: 23 mg/dL — ABNORMAL HIGH (ref 7–21)
BUN / CREAT RATIO: 27
CALCIUM: 9 mg/dL (ref 8.5–10.2)
CHLORIDE: 105 mmol/L (ref 98–107)
CO2: 24 mmol/L (ref 22.0–30.0)
CREATININE: 0.85 mg/dL (ref 0.70–1.30)
EGFR CKD-EPI AA MALE: >90 mL/min/{1.73_m2} (ref >=60)
EGFR CKD-EPI NON-AA MALE: 82 mL/min/{1.73_m2} (ref >=60)
GLUCOSE RANDOM: 140 mg/dL (ref 70–179)
POTASSIUM: 3.5 mmol/L (ref 3.5–5.0)
PROTEIN TOTAL: 6.3 g/dL — ABNORMAL LOW (ref 6.5–8.3)
SODIUM: 139 mmol/L (ref 135–145)

## 2019-07-05 LAB — POTASSIUM: Potassium:SCnc:Pt:Ser/Plas:Qn:: 3.3 — ABNORMAL LOW

## 2019-07-05 LAB — LIPID PANEL
CHOLESTEROL/HDL RATIO SCREEN: 4 (ref <5.0)
HDL CHOLESTEROL: 31 mg/dL — ABNORMAL LOW (ref 40–59)
TRIGLYCERIDES: 69 mg/dL (ref 1–149)
VLDL CHOLESTEROL CAL: 13.8 mg/dL (ref 12–42)

## 2019-07-05 LAB — ERYTHROCYTE SEDIMENTATION RATE: Lab: 75 — ABNORMAL HIGH

## 2019-07-05 LAB — CREATININE: Creatinine:MCnc:Pt:Ser/Plas:Qn:: 0.59 — ABNORMAL LOW

## 2019-07-05 LAB — PRO-BNP: Natriuretic peptide.B prohormone N-Terminal:MCnc:Pt:Ser/Plas:Qn:: 1640 — ABNORMAL HIGH

## 2019-07-05 LAB — FREE T4: Thyroxine.free:MCnc:Pt:Ser/Plas:Qn:: 1.84 — ABNORMAL HIGH

## 2019-07-05 LAB — PROTEIN UA: Protein:MCnc:Pt:Urine:Qn:Test strip: 30 — AB

## 2019-07-05 LAB — LACTATE BLOOD VENOUS: Lactate:SCnc:Pt:BldV:Qn:: 2.2 — ABNORMAL HIGH

## 2019-07-05 LAB — C-REACTIVE PROTEIN: C reactive protein:MCnc:Pt:Ser/Plas:Qn:: 221.5 — ABNORMAL HIGH

## 2019-07-05 LAB — THYROID STIMULATING HORMONE: Thyrotropin:ACnc:Pt:Ser/Plas:Qn:: 2.44

## 2019-07-05 LAB — MAGNESIUM: Magnesium:MCnc:Pt:Ser/Plas:Qn:: 1.7

## 2019-07-05 MED ADMIN — cholecalciferol (vitamin D3 25 mcg (1,000 units)) tablet 12.5 mcg: 12.5 ug | ORAL | @ 15:00:00

## 2019-07-05 MED ADMIN — vancomycin (VANCOCIN) 2000 mg IVPB in 500 mL sodium chloride 0.9% (premix): 2000 mg | INTRAVENOUS | @ 07:00:00 | Stop: 2019-07-05

## 2019-07-05 MED ADMIN — magnesium sulfate 2gm/50mL IVPB: 2 g | INTRAVENOUS | @ 13:00:00 | Stop: 2019-07-05

## 2019-07-05 MED ADMIN — calcium carbonate (OS-CAL) tablet 600 mg of elem calcium: 600 mg | ORAL | @ 15:00:00

## 2019-07-05 MED ADMIN — potassium chloride 10 mEq in 100 mL IVPB: 10 meq | INTRAVENOUS | @ 15:00:00 | Stop: 2019-07-05

## 2019-07-05 MED ADMIN — potassium chloride (KLOR-CON) CR tablet 40 mEq: 40 meq | ORAL | @ 13:00:00 | Stop: 2019-07-05

## 2019-07-05 MED ADMIN — aspirin chewable tablet 324 mg: 324 mg | ORAL | @ 08:00:00 | Stop: 2019-07-05

## 2019-07-05 MED ADMIN — potassium chloride (KLOR-CON) CR tablet 40 mEq: 40 meq | ORAL | @ 21:00:00 | Stop: 2019-07-05

## 2019-07-05 MED ADMIN — lactated ringers bolus 1,000 mL: 1000 mL | INTRAVENOUS | @ 07:00:00 | Stop: 2019-07-05

## 2019-07-05 MED ADMIN — cefepime (MAXIPIME) 2 g in dextrose 100 mL IVPB (premix): 2 g | INTRAVENOUS | @ 07:00:00 | Stop: 2019-07-05

## 2019-07-05 MED ADMIN — sulfamethoxazole-trimethoprim (BACTRIM DS) 800-160 mg tablet 160 mg of trimethoprim: 1 | ORAL | @ 13:00:00 | Stop: 2019-07-05

## 2019-07-05 MED ADMIN — pravastatin (PRAVACHOL) tablet 40 mg: 40 mg | ORAL | @ 10:00:00

## 2019-07-05 MED ADMIN — aspirin chewable tablet 81 mg: 81 mg | ORAL | @ 13:00:00

## 2019-07-05 MED ADMIN — furosemide (LASIX) injection 40 mg: 40 mg | INTRAVENOUS | @ 11:00:00 | Stop: 2019-07-05

## 2019-07-05 MED ADMIN — levothyroxine (SYNTHROID) tablet 100 mcg: 100 ug | ORAL | @ 10:00:00

## 2019-07-05 NOTE — Unmapped (Signed)
Due to patient's cardiac device:    Ordering MD needs to contact EP Services at [phone] for interrogation of Cardiac Device or, if outside of normal clinic hours, contact Cardiology Fellow for consult to evaluate if MRI exam can be performed.      In addition to the Interrogation of the Patient's Device, if device is a MRI Non-Conditional, in order to follow Safety Protocol, we need the following placed as a note in EPIC:           I, ___________________________(referring physician's name),     am ordering a _______________________________MRI for     ______________________________(patient name and MRN).     I have determined that this MRI is clinically-indicated without acceptable alternative imaging techniques available and the results of this MRI will impact patient care.  I have discussed this with the patient and the patient agrees to undergo the MRI.       Thank You,    MRI Technologist    Please feel free to call the MRI Department [phone] or MRI Supervisors, Clif Stallings and [employer], with questions.

## 2019-07-05 NOTE — Unmapped (Signed)
Pt here with EMS for increased weakness

## 2019-07-05 NOTE — Unmapped (Signed)
Bed: 15-B  Expected date:   Expected time:   Means of arrival:   Comments:  EMS  44y M with increased weakness  Hx of Bone Ca  With pacemaker  VSS reported
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
## 2019-07-05 NOTE — Unmapped (Signed)
MRI on hold until screen form is completed in epic. Please answer all the questions in the form. If patient isn't able to answer the questions, please have immediate family member to help answer the questions and list their name and their number in the form. Patients needs to be in a hospital gown, IV should be med locked and medication patches should be removed prior to coming to MRI. Please call MRI at [phone] if you have any question. Thank you!

## 2019-07-06 LAB — MEAN PLATELET VOLUME: Platelet mean volume:EntVol:Pt:Bld:Qn:Automated count: 8.4

## 2019-07-06 LAB — BASIC METABOLIC PANEL
ANION GAP: 4 mmol/L — ABNORMAL LOW (ref 7–15)
BUN / CREAT RATIO: 30
CALCIUM: 8 mg/dL — ABNORMAL LOW (ref 8.5–10.2)
CHLORIDE: 107 mmol/L (ref 98–107)
CO2: 25 mmol/L (ref 22.0–30.0)
CREATININE: 0.61 mg/dL — ABNORMAL LOW (ref 0.70–1.30)
EGFR CKD-EPI AA MALE: >90 mL/min/{1.73_m2} (ref >=60)
EGFR CKD-EPI NON-AA MALE: >90 mL/min/{1.73_m2} (ref >=60)
GLUCOSE RANDOM: 123 mg/dL (ref 70–179)
POTASSIUM: 3.8 mmol/L (ref 3.5–5.0)
SODIUM: 136 mmol/L (ref 135–145)

## 2019-07-06 LAB — CBC
HEMATOCRIT: 30.3 % — ABNORMAL LOW (ref 41.0–53.0)
MEAN CORPUSCULAR HEMOGLOBIN CONC: 33.3 g/dL (ref 31.0–37.0)
MEAN CORPUSCULAR HEMOGLOBIN: 32.9 pg (ref 26.0–34.0)
MEAN CORPUSCULAR VOLUME: 99.1 fL (ref 80.0–100.0)
MEAN PLATELET VOLUME: 8.4 fL (ref 7.0–10.0)
PLATELET COUNT: 227 10*9/L (ref 150–440)
RED BLOOD CELL COUNT: 3.06 10*12/L — ABNORMAL LOW (ref 4.50–5.90)
RED CELL DISTRIBUTION WIDTH: 14.6 % (ref 12.0–15.0)
WBC ADJUSTED: 4.6 10*9/L (ref 4.5–11.0)

## 2019-07-06 LAB — CHLORIDE: Chloride:SCnc:Pt:Ser/Plas:Qn:: 107

## 2019-07-06 LAB — ESTIMATED AVERAGE GLUCOSE: Estimated average glucose:MCnc:Pt:Bld:Qn:Estimated from glycated hemoglobin: 120

## 2019-07-06 LAB — HEMOGLOBIN A1C: ESTIMATED AVERAGE GLUCOSE: 120 mg/dL

## 2019-07-06 MED ADMIN — cephalexin (KEFLEX) capsule 500 mg: 500 mg | ORAL | @ 02:00:00 | Stop: 2019-07-12

## 2019-07-06 MED ADMIN — cephalexin (KEFLEX) capsule 500 mg: 500 mg | ORAL | @ 21:00:00 | Stop: 2019-07-12

## 2019-07-06 MED ADMIN — cephalexin (KEFLEX) capsule 500 mg: 500 mg | ORAL | @ 13:00:00 | Stop: 2019-07-12

## 2019-07-06 MED ADMIN — pravastatin (PRAVACHOL) tablet 40 mg: 40 mg | ORAL

## 2019-07-06 MED ADMIN — cholecalciferol (vitamin D3 25 mcg (1,000 units)) tablet 12.5 mcg: 12.5 ug | ORAL | @ 13:00:00

## 2019-07-06 MED ADMIN — calcium carbonate (OS-CAL) tablet 600 mg of elem calcium: 600 mg | ORAL

## 2019-07-06 MED ADMIN — cephalexin (KEFLEX) capsule 500 mg: 500 mg | ORAL | @ 09:00:00 | Stop: 2019-07-12

## 2019-07-06 MED ADMIN — gabapentin (NEURONTIN) capsule 300 mg: 300 mg | ORAL

## 2019-07-06 MED ADMIN — furosemide (LASIX) tablet 20 mg: 20 mg | ORAL | @ 16:00:00

## 2019-07-06 MED ADMIN — calcium carbonate (OS-CAL) tablet 600 mg of elem calcium: 600 mg | ORAL | @ 13:00:00

## 2019-07-06 MED ADMIN — chlorthalidone (HYGROTON) tablet 25 mg: 25 mg | ORAL | @ 13:00:00 | Stop: 2019-07-06

## 2019-07-06 MED ADMIN — levothyroxine (SYNTHROID) tablet 100 mcg: 100 ug | ORAL | @ 11:00:00
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
## 2019-07-06 NOTE — Unmapped (Signed)
WOCN Consult Services                                                 Wound Evaluation: Pressure Injury    Reason for Consult:   - Initial  - Wound    Problem List:   Active Problems:    * No active hospital problems. *    Assessment: Per EMR, Michael [name] is a 84 y.o. male with PMHx HTN, HLD, HFrEF (EF 40-45%), SSS s/p pacemaker placement, hypothyroidism, hx DVT off treatment, metastatic castrate-resistant prostate cancer who presents to [facility] with cellulitis.    CWOCN consulted for R heel chronic ulceration. Discussed with primary team over the phone recommending Podiatry consult for more aggressive sharp debridement for periwound callus noted circumferentially from photo on 6/20. Pt discussed with Neece PA w/[facility]. Podiatry will continue to follow pt, we will defer to their service at this time, will sign off for now.     Lab Results   Component Value Date    WBC 4.6 07/06/2019    HGB 10.1 (L) 07/06/2019    HCT 30.3 (L) 07/06/2019    ESR 75 (H) 07/05/2019    CRP 221.5 (H) 07/05/2019    A1C 5.8 (H) 07/05/2019    GLUF 112 04/28/2014    GLU 123 07/06/2019    ALBUMIN 3.5 07/05/2019    PROT 6.3 (L) 07/05/2019     Braden Scale Score: 19     Recommended Consults:  - Podiatry-following    WOCN Follow Up:  - We will sign off at this time    Plan of Care Discussed With:   - RN primary  - LIP Jamse Mead MD  - Denhoff PA, [facility]    Supplies Ordered: No    Workup Time:   15 minutes     Ann (Devanee Pomplun-Chia) Henderson Newcomer RN, BSN CWOCN [facility]  [phone]  Phone- [phone]

## 2019-07-06 NOTE — Unmapped (Signed)
Pt remains free from falls/injury with VSS. Pt had two episodes of bowel incontinence and has a condom catheter on. Pt worked with PT/OT and sat in chair for most of the afternoon. Plan is for WOCN to see pt and assess right heel ulcer. Pt denies needs at this time. Will continue to monitor and reassess.

## 2019-07-06 NOTE — Unmapped (Signed)
Afebrile overnight, denies pain, continues to have loose stools. WOCN consult pending. Care clustered to promote sleep. Monitor

## 2019-07-07 LAB — HEMOGLOBIN: Hemoglobin:MCnc:Pt:Bld:Qn:: 10.5 — ABNORMAL LOW

## 2019-07-07 LAB — BASIC METABOLIC PANEL
ANION GAP: 4 mmol/L — ABNORMAL LOW (ref 7–15)
BUN / CREAT RATIO: 28
CALCIUM: 8.5 mg/dL (ref 8.5–10.2)
CHLORIDE: 106 mmol/L (ref 98–107)
CO2: 25 mmol/L (ref 22.0–30.0)
CREATININE: 0.54 mg/dL — ABNORMAL LOW (ref 0.70–1.30)
EGFR CKD-EPI NON-AA MALE: >90 mL/min/{1.73_m2} (ref >=60)
GLUCOSE RANDOM: 97 mg/dL (ref 70–179)
POTASSIUM: 4 mmol/L (ref 3.5–5.0)
SODIUM: 135 mmol/L (ref 135–145)

## 2019-07-07 LAB — CBC
HEMATOCRIT: 31.1 % — ABNORMAL LOW (ref 41.0–53.0)
HEMOGLOBIN: 10.5 g/dL — ABNORMAL LOW (ref 13.5–17.5)
MEAN CORPUSCULAR HEMOGLOBIN: 33.2 pg (ref 26.0–34.0)
MEAN CORPUSCULAR VOLUME: 98.4 fL (ref 80.0–100.0)
MEAN PLATELET VOLUME: 8.1 fL (ref 7.0–10.0)
PLATELET COUNT: 279 10*9/L (ref 150–440)
RED BLOOD CELL COUNT: 3.16 10*12/L — ABNORMAL LOW (ref 4.50–5.90)
RED CELL DISTRIBUTION WIDTH: 14.7 % (ref 12.0–15.0)
WBC ADJUSTED: 3.7 10*9/L — ABNORMAL LOW (ref 4.5–11.0)

## 2019-07-07 LAB — EGFR CKD-EPI AA MALE: Glomerular filtration rate/1.73 sq M.predicted.black:ArVRat:Pt:Ser/Plas/Bld:Qn:Creatinine-based formula (CKD-EPI): >90

## 2019-07-07 MED ADMIN — aspirin chewable tablet 81 mg: 81 mg | ORAL | @ 13:00:00

## 2019-07-07 MED ADMIN — cholecalciferol (vitamin D3 25 mcg (1,000 units)) tablet 12.5 mcg: 12.5 ug | ORAL | @ 01:00:00

## 2019-07-07 MED ADMIN — enoxaparin (LOVENOX) syringe 40 mg: 40 mg | SUBCUTANEOUS | @ 09:00:00

## 2019-07-07 MED ADMIN — gabapentin (NEURONTIN) capsule 300 mg: 300 mg | ORAL | @ 01:00:00

## 2019-07-07 MED ADMIN — cholecalciferol (vitamin D3 25 mcg (1,000 units)) tablet 12.5 mcg: 12.5 ug | ORAL | @ 13:00:00

## 2019-07-07 MED ADMIN — pravastatin (PRAVACHOL) tablet 40 mg: 40 mg | ORAL | @ 01:00:00

## 2019-07-07 MED ADMIN — calcium carbonate (OS-CAL) tablet 600 mg of elem calcium: 600 mg | ORAL | @ 13:00:00

## 2019-07-07 MED ADMIN — cephalexin (KEFLEX) capsule 500 mg: 500 mg | ORAL | @ 15:00:00 | Stop: 2019-07-12

## 2019-07-07 MED ADMIN — cephalexin (KEFLEX) capsule 500 mg: 500 mg | ORAL | @ 21:00:00 | Stop: 2019-07-12

## 2019-07-07 MED ADMIN — cephalexin (KEFLEX) capsule 500 mg: 500 mg | ORAL | @ 09:00:00 | Stop: 2019-07-12

## 2019-07-07 MED ADMIN — enzalutamide (XTANDI) capsule 120 mg **Patient Home Med**: 120 mg | ORAL | @ 13:00:00

## 2019-07-07 MED ADMIN — calcium carbonate (OS-CAL) tablet 600 mg of elem calcium: 600 mg | ORAL | @ 01:00:00
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
## 2019-07-07 NOTE — Unmapped (Signed)
Mr Hildreth is here with his heel wound on PO abx. MD placed ID c/s today. Plan for pt to go to OR for bone debridement of rt heel. VSS. Pt sat in chair much of the day, on chair alarm. No falls this shift. WCTM

## 2019-07-07 NOTE — Unmapped (Signed)
Pt AAO x3, afebrile and denies any need for pain meds. Pt sat on chair, BM x2 noted, family was at bedside. Bed alarm activated for safety.
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
## 2019-07-07 NOTE — Unmapped (Signed)
Pt alert x oriented, afebrile, VSS. Pt had no reports of pain throughout the shift. Pt R heel dressing changed per orders. Pt has off loading and heel relief boots at bedside, ordered per MD. Pt continues on PO ABX. Pt free from falls and injuries. Bed alarm activated for safety. Will continue to monitor pt status.

## 2019-07-07 NOTE — Unmapped (Signed)
Procedure: right foot, Wound Location right heel   At this time patient's wound was debrided due to the increase in Devitalized tissue of epidermis/dermis, exudate, callus tissue, and other necrotic tissue impairing proper wound healing as reported in the Objective. All benefits, risks, and alternatives were explained in detail with the patient. A time out was taken prior to the procedure. The area was prepped in standard  fashion. An excisional Sharp, surgical debridement was performed to fully debride and remove all central devitalized and necrotic tissue including subcutaneous tissue. The debridement occurred to the level of subcutaneous tissue until  bleeding granulation tissue was observed in 100% of the wound bed.  This was achieved with the use of a curette, forceps and scalpel. Blood loss was minimal.  Hemostasis was achieved with pressure and the post-debridement measurements are 2.5 x 1.8 x 1.5 cm. Patient tolerated procedure well at this time.
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
## 2019-07-08 LAB — CBC
HEMATOCRIT: 36.2 % — ABNORMAL LOW (ref 41.0–53.0)
HEMOGLOBIN: 11.5 g/dL — ABNORMAL LOW (ref 13.5–17.5)
MEAN CORPUSCULAR HEMOGLOBIN CONC: 31.7 g/dL (ref 31.0–37.0)
MEAN CORPUSCULAR HEMOGLOBIN: 32.8 pg (ref 26.0–34.0)
MEAN CORPUSCULAR VOLUME: 103.5 fL — ABNORMAL HIGH (ref 80.0–100.0)
MEAN PLATELET VOLUME: 9.8 fL (ref 7.0–10.0)
PLATELET COUNT: 237 10*9/L (ref 150–440)
RED BLOOD CELL COUNT: 3.5 10*12/L — ABNORMAL LOW (ref 4.50–5.90)
RED CELL DISTRIBUTION WIDTH: 14.5 % (ref 12.0–15.0)

## 2019-07-08 LAB — BASIC METABOLIC PANEL
ANION GAP: 9 mmol/L (ref 7–15)
BLOOD UREA NITROGEN: 16 mg/dL (ref 7–21)
BUN / CREAT RATIO: 33
CALCIUM: 8.7 mg/dL (ref 8.5–10.2)
CHLORIDE: 108 mmol/L — ABNORMAL HIGH (ref 98–107)
CREATININE: 0.48 mg/dL — ABNORMAL LOW (ref 0.70–1.30)
EGFR CKD-EPI AA MALE: >90 mL/min/{1.73_m2} (ref >=60)
EGFR CKD-EPI NON-AA MALE: >90 mL/min/{1.73_m2} (ref >=60)
GLUCOSE RANDOM: 97 mg/dL (ref 70–179)
POTASSIUM: 4.6 mmol/L (ref 3.5–5.0)
SODIUM: 137 mmol/L (ref 135–145)

## 2019-07-08 LAB — ERYTHROCYTE SEDIMENTATION RATE: Lab: 87 — ABNORMAL HIGH

## 2019-07-08 LAB — BUN / CREAT RATIO: Urea nitrogen/Creatinine:MRto:Pt:Ser/Plas:Qn:: 33

## 2019-07-08 LAB — MEAN CORPUSCULAR HEMOGLOBIN: Erythrocyte mean corpuscular hemoglobin:EntMass:Pt:RBC:Qn:Automated count: 32.8

## 2019-07-08 MED ADMIN — aspirin chewable tablet 81 mg: 81 mg | ORAL | @ 14:00:00

## 2019-07-08 MED ADMIN — cholecalciferol (vitamin D3 25 mcg (1,000 units)) tablet 12.5 mcg: 12.5 ug | ORAL | @ 01:00:00

## 2019-07-08 MED ADMIN — pravastatin (PRAVACHOL) tablet 40 mg: 40 mg | ORAL | @ 01:00:00

## 2019-07-08 MED ADMIN — cephalexin (KEFLEX) capsule 500 mg: 500 mg | ORAL | @ 21:00:00 | Stop: 2019-07-08

## 2019-07-08 MED ADMIN — cholecalciferol (vitamin D3 25 mcg (1,000 units)) tablet 12.5 mcg: 12.5 ug | ORAL | @ 14:00:00

## 2019-07-08 MED ADMIN — enoxaparin (LOVENOX) syringe 40 mg: 40 mg | SUBCUTANEOUS | @ 12:00:00 | Stop: 2019-07-08

## 2019-07-08 MED ADMIN — cephalexin (KEFLEX) capsule 500 mg: 500 mg | ORAL | @ 14:00:00 | Stop: 2019-07-08

## 2019-07-08 MED ADMIN — cephalexin (KEFLEX) capsule 500 mg: 500 mg | ORAL | @ 01:00:00 | Stop: 2019-07-12

## 2019-07-08 MED ADMIN — calcium carbonate (OS-CAL) tablet 600 mg of elem calcium: 600 mg | ORAL | @ 01:00:00
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
## 2019-07-08 NOTE — Unmapped (Signed)
PICC LINE TRIAGE NOTE    The Venous Access Team has received an order for PICC placement.  This patient has been triaged.  Pt found to have a history of sick sinus syndrome which typically requires fluoroscopic guidance.  AICD in place on left, only right arm available for attempt.  Will refer to VIR for placement under fluoroscopy.    Chat sent to Drs Baldemar Lenis and Bishop Limbo of [facility].      Thank You,    Iantha Fallen RN Venous Access Team [phone]      Workup Time:  30 minutes

## 2019-07-08 NOTE — Unmapped (Signed)
Problem: Wound  Goal: Optimal Wound Healing  Outcome: Progressing  Intervention: Promote Effective Wound Healing  Flowsheets (Taken 07/08/2019 0502)  Pain Management Interventions: care clustered

## 2019-07-09 LAB — CBC
HEMOGLOBIN: 10.8 g/dL — ABNORMAL LOW (ref 13.5–17.5)
MEAN CORPUSCULAR HEMOGLOBIN CONC: 33.4 g/dL (ref 31.0–37.0)
MEAN CORPUSCULAR HEMOGLOBIN: 33.2 pg (ref 26.0–34.0)
MEAN CORPUSCULAR VOLUME: 99.3 fL (ref 80.0–100.0)
MEAN PLATELET VOLUME: 8.3 fL (ref 7.0–10.0)
RED BLOOD CELL COUNT: 3.25 10*12/L — ABNORMAL LOW (ref 4.50–5.90)
RED CELL DISTRIBUTION WIDTH: 14.7 % (ref 12.0–15.0)
WBC ADJUSTED: 3.4 10*9/L — ABNORMAL LOW (ref 4.5–11.0)

## 2019-07-09 LAB — BASIC METABOLIC PANEL
ANION GAP: 5 mmol/L — ABNORMAL LOW (ref 7–15)
BLOOD UREA NITROGEN: 18 mg/dL (ref 7–21)
BUN / CREAT RATIO: 32
CALCIUM: 8.6 mg/dL (ref 8.5–10.2)
CHLORIDE: 104 mmol/L (ref 98–107)
CO2: 28 mmol/L (ref 22.0–30.0)
CREATININE: 0.57 mg/dL — ABNORMAL LOW (ref 0.70–1.30)
EGFR CKD-EPI AA MALE: >90 mL/min/{1.73_m2} (ref >=60)
GLUCOSE RANDOM: 97 mg/dL (ref 70–179)
POTASSIUM: 4.5 mmol/L (ref 3.5–5.0)
SODIUM: 137 mmol/L (ref 135–145)

## 2019-07-09 LAB — RED BLOOD CELL COUNT: Lab: 3.25 — ABNORMAL LOW

## 2019-07-09 LAB — ANION GAP: Anion gap 3:SCnc:Pt:Ser/Plas:Qn:: 5 — ABNORMAL LOW

## 2019-07-09 MED ADMIN — ropivacaine (NAROPIN) 5 mg/mL (0.5 %) injection: PERINEURAL | @ 17:00:00 | Stop: 2019-07-09

## 2019-07-09 MED ADMIN — phenylephrine HCl in 0.9% NaCl 0.8 mg/10 mL (80 mcg/mL) injection Syrg: INTRAVENOUS | @ 18:00:00 | Stop: 2019-07-09

## 2019-07-09 MED ADMIN — absorbable hemostatic particle (ARISTA): TOPICAL | @ 18:00:00 | Stop: 2019-07-09

## 2019-07-09 MED ADMIN — fentaNYL (PF) (SUBLIMAZE) injection: INTRAVENOUS | @ 17:00:00 | Stop: 2019-07-09

## 2019-07-09 MED ADMIN — lidocaine (XYLOCAINE) 20 mg/mL (2 %) injection: INTRAVENOUS | @ 17:00:00 | Stop: 2019-07-09

## 2019-07-09 MED ADMIN — gabapentin (NEURONTIN) capsule 300 mg: 300 mg | ORAL | @ 01:00:00

## 2019-07-09 MED ADMIN — cholecalciferol (vitamin D3 25 mcg (1,000 units)) tablet 12.5 mcg: 12.5 ug | ORAL | @ 13:00:00

## 2019-07-09 MED ADMIN — cephalexin (KEFLEX) capsule 500 mg: 500 mg | ORAL | @ 01:00:00 | Stop: 2019-07-08

## 2019-07-09 MED ADMIN — vancomycin (VANCOCIN) 1750 mg in sodium chloride (NS) 0.9 % 500 mL IVPB (premix): 1750 mg | INTRAVENOUS | @ 22:00:00 | Stop: 2019-07-23

## 2019-07-09 MED ADMIN — calcium carbonate (OS-CAL) tablet 600 mg of elem calcium: 600 mg | ORAL | @ 13:00:00

## 2019-07-09 MED ADMIN — sodium chloride irrigation (NS) 0.9 % irrigation solution: @ 17:00:00 | Stop: 2019-07-09

## 2019-07-09 MED ADMIN — propofol (DIPRIVAN) infusion 10 mg/mL: INTRAVENOUS | @ 17:00:00 | Stop: 2019-07-09

## 2019-07-09 MED ADMIN — levothyroxine (SYNTHROID) tablet 100 mcg: 100 ug | ORAL | @ 11:00:00

## 2019-07-09 MED ADMIN — aspirin chewable tablet 81 mg: 81 mg | ORAL | @ 13:00:00

## 2019-07-09 MED ADMIN — fentaNYL (PF) (SUBLIMAZE) injection: INTRAVENOUS | @ 16:00:00 | Stop: 2019-07-09

## 2019-07-09 MED ADMIN — lactated Ringers infusion: INTRAVENOUS | @ 15:00:00 | Stop: 2019-07-09

## 2019-07-09 MED ADMIN — pravastatin (PRAVACHOL) tablet 40 mg: 40 mg | ORAL | @ 01:00:00

## 2019-07-09 MED ADMIN — lactated Ringers infusion: INTRAVENOUS | @ 16:00:00 | Stop: 2019-07-09

## 2019-07-09 MED ADMIN — sodium chloride irrigation (NS) 0.9 % irrigation solution: @ 18:00:00 | Stop: 2019-07-09

## 2019-07-09 MED ADMIN — enzalutamide (XTANDI) capsule 120 mg **Patient Home Med**: 120 mg | ORAL | @ 13:00:00

## 2019-07-09 MED ADMIN — cholecalciferol (vitamin D3 25 mcg (1,000 units)) tablet 12.5 mcg: 12.5 ug | ORAL | @ 01:00:00

## 2019-07-09 NOTE — Unmapped (Signed)
Brief Operative Note  (CSN: [id number])      Date of Surgery: 07/09/2019    Pre-op Diagnosis: Osteomyelitis of the right heel     Post-op Diagnosis: same    Procedure(s):  INCISION, BONE CORTEX, FOOT:   APPLY GRAFT FACE/SCALP/EYE/MOUTH/NECK/EAR/ORBIT/GENITAL/HAND/FEET/MULT DGT, AREA TO 100 SQ CM; 1ST 25 SQ CM:   Note: Revisions to procedures should be made in chart - see Procedures activity.    Performing Service: Vascular  Surgeon(s) and Role:     * Karen Chafe, DPM - Primary    Assistant: None    Findings: Necrotic fat to level of calcaneous including bone and periosteal tissue. Improved with resection and debridement of plantar calcaneous until healthy bleeding was appreciated.     Anesthesia: Preop Block + Monitor Anesthesia Care    Estimated Blood Loss: 25 mL    Complications: None    Specimens:   ID Type Source Tests Collected by Time Destination   1 : Right calcaneus  Bone Foot, Right SURGICAL PATHOLOGY EXAM Margarito Liner [place], DPM 07/09/2019 1441    A : Right calcaneus  Bone Foot, Right AEROBIC/ANAEROBIC CULTURE, AFB CULTURE Margarito Liner [place], [facility] 07/09/2019 1441        Implants:   Implant Name Type Inv. Item Serial No. Manufacturer Lot No. LRB No. Used Action   [facility] HUMAN ALLOGRAFT SM - [id number] Graft [facility] HUMAN ALLOGRAFT SM  [employer] [id number] Right 1 Implanted       Surgeon Notes: I was present and scrubbed for the entire procedure    Gwenyth Ober   Date: 07/09/2019  Time: 2:43 PM
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
## 2019-07-09 NOTE — Unmapped (Signed)
VENOUS ACCESS ULTRASOUND PROCEDURE NOTE    Indications:   Poor venous access.    The Venous Access Team has assessed this patient for the placement of a PIV. Ultrasound guidance was necessary to obtain access.     Procedure Details:  Identity of the patient was confirmed via name, medical record number and date of birth. The availability of the correct equipment was verified.    The vein was identified for ultrasound catheter insertion.  Field was prepared with necessary supplies and equipment.  Probe cover and sterile gel utilized.  Insertion site was prepped with chlorhexidine solution and allowed to dry.  The catheter extension was primed with normal saline.A(n) 22 g x 1.75 inch catheter was placed in the left forearm with 1 attempt(s).     Catheter aspirated, 4 mL blood return present. The catheter was then flushed with 10 mL of normal saline. Insertion site cleansed, and dressing applied per manufacturer guidelines. The catheter was inserted without difficulty  by Michel Santee RN.      RN was notified.     Thank you,     Michel Santee RN Venous Access Team   [phone]     Workup / Procedure Time:  30 minutes    See vein image(s) in PACs.
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
## 2019-07-09 NOTE — Unmapped (Signed)
VSS, a&o x4. Pt denies pain. R heel dressing changed per orders. PIV infiltrated- team notified, waiting for response if need to place another PIV or not due to plans for PICC placement soon. No acute events during shift, fall precautions maintained. Wctm.     Problem: Adult Inpatient Plan of Care  Goal: Plan of Care Review  Outcome: Ongoing - Unchanged  Goal: Patient-Specific Goal (Individualization)  Outcome: Ongoing - Unchanged  Goal: Absence of Hospital-Acquired Illness or Injury  Outcome: Ongoing - Unchanged  Goal: Optimal Comfort and Wellbeing  Outcome: Ongoing - Unchanged  Goal: Readiness for Transition of Care  Outcome: Ongoing - Unchanged  Goal: Rounds/Family Conference  Outcome: Ongoing - Unchanged     Problem: Wound  Goal: Optimal Wound Healing  Outcome: Ongoing - Unchanged     Problem: Skin Injury Risk Increased  Goal: Skin Health and Integrity  Outcome: Ongoing - Unchanged     Problem: Fall Injury Risk  Goal: Absence of Fall and Fall-Related Injury  Outcome: Ongoing - Unchanged

## 2019-07-09 NOTE — Unmapped (Addendum)
VSS, a&o x4. Pt denies pain/discomfort. Pt currently off unit in OR for debridement of R heel. Daughter and wife waiting at bedside. Fall & safety precautions maintained, wctm when back from OR.     Problem: Adult Inpatient Plan of Care  Goal: Plan of Care Review  Outcome: Ongoing - Unchanged  Goal: Patient-Specific Goal (Individualization)  Outcome: Ongoing - Unchanged  Goal: Absence of Hospital-Acquired Illness or Injury  Outcome: Ongoing - Unchanged  Goal: Optimal Comfort and Wellbeing  Outcome: Ongoing - Unchanged  Goal: Readiness for Transition of Care  Outcome: Ongoing - Unchanged  Goal: Rounds/Family Conference  Outcome: Ongoing - Unchanged

## 2019-07-10 LAB — BASIC METABOLIC PANEL
ANION GAP: 4 mmol/L — ABNORMAL LOW (ref 7–15)
BLOOD UREA NITROGEN: 20 mg/dL (ref 7–21)
CALCIUM: 8.4 mg/dL — ABNORMAL LOW (ref 8.5–10.2)
CHLORIDE: 104 mmol/L (ref 98–107)
CO2: 29 mmol/L (ref 22.0–30.0)
CREATININE: 0.61 mg/dL — ABNORMAL LOW (ref 0.70–1.30)
EGFR CKD-EPI AA MALE: >90 mL/min/{1.73_m2} (ref >=60)
EGFR CKD-EPI NON-AA MALE: >90 mL/min/{1.73_m2} (ref >=60)
GLUCOSE RANDOM: 92 mg/dL (ref 70–179)
POTASSIUM: 4.6 mmol/L (ref 3.5–5.0)
SODIUM: 137 mmol/L (ref 135–145)

## 2019-07-10 LAB — CBC
HEMATOCRIT: 32.4 % — ABNORMAL LOW (ref 41.0–53.0)
HEMOGLOBIN: 10.4 g/dL — ABNORMAL LOW (ref 13.5–17.5)
MEAN CORPUSCULAR HEMOGLOBIN CONC: 32 g/dL (ref 31.0–37.0)
MEAN CORPUSCULAR HEMOGLOBIN: 31.8 pg (ref 26.0–34.0)
MEAN CORPUSCULAR VOLUME: 99.7 fL (ref 80.0–100.0)
MEAN PLATELET VOLUME: 8.3 fL (ref 7.0–10.0)
PLATELET COUNT: 284 10*9/L (ref 150–440)
RED BLOOD CELL COUNT: 3.26 10*12/L — ABNORMAL LOW (ref 4.50–5.90)
WBC ADJUSTED: 4.7 10*9/L (ref 4.5–11.0)

## 2019-07-10 LAB — EGFR CKD-EPI AA MALE: Glomerular filtration rate/1.73 sq M.predicted.black:ArVRat:Pt:Ser/Plas/Bld:Qn:Creatinine-based formula (CKD-EPI): >90

## 2019-07-10 LAB — MEAN CORPUSCULAR HEMOGLOBIN: Erythrocyte mean corpuscular hemoglobin:EntMass:Pt:RBC:Qn:Automated count: 31.8

## 2019-07-10 MED ADMIN — vancomycin (VANCOCIN) 1750 mg in sodium chloride (NS) 0.9 % 500 mL IVPB (premix): 1750 mg | INTRAVENOUS | @ 22:00:00 | Stop: 2019-07-23

## 2019-07-10 MED ADMIN — aspirin chewable tablet 81 mg: 81 mg | ORAL | @ 13:00:00

## 2019-07-10 MED ADMIN — cholecalciferol (vitamin D3 25 mcg (1,000 units)) tablet 12.5 mcg: 12.5 ug | ORAL | @ 02:00:00

## 2019-07-10 MED ADMIN — fentaNYL (PF) (SUBLIMAZE) injection: INTRAVENOUS | @ 15:00:00 | Stop: 2019-07-10

## 2019-07-10 MED ADMIN — levothyroxine (SYNTHROID) tablet 100 mcg: 100 ug | ORAL | @ 11:00:00

## 2019-07-10 MED ADMIN — gabapentin (NEURONTIN) capsule 300 mg: 300 mg | ORAL | @ 02:00:00

## 2019-07-10 MED ADMIN — metroNIDAZOLE (FLAGYL) tablet 500 mg: 500 mg | ORAL | @ 18:00:00 | Stop: 2019-07-23

## 2019-07-10 MED ADMIN — calcium carbonate (OS-CAL) tablet 600 mg of elem calcium: 600 mg | ORAL | @ 13:00:00

## 2019-07-10 MED ADMIN — metroNIDAZOLE (FLAGYL) tablet 500 mg: 500 mg | ORAL | @ 02:00:00 | Stop: 2019-07-23

## 2019-07-10 MED ADMIN — midazolam (VERSED) injection: INTRAVENOUS | @ 15:00:00 | Stop: 2019-07-10
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
## 2019-07-10 NOTE — Unmapped (Signed)
A&O,VSS, denies pain. No acute events overnight. Safety maintained, will continue to monitor.

## 2019-07-10 NOTE — Unmapped (Signed)
Vancomycin Therapeutic Monitoring Pharmacy Note    Jorian Willhoite is a 84 y.o. male starting vancomycin. Date of therapy initiation: 07/09/19    Indication: Osteomyelitis    Prior Dosing Information: None/new initiation     Goals:  Therapeutic Drug Levels  Vancomycin trough goal: 15-20 mg/L    Additional Clinical Monitoring/Outcomes  Renal function, volume status (intake and output)    Results: Not applicable    Wt Readings from Last 1 Encounters:   07/07/19 95.2 kg (209 lb 14.4 oz)     Creatinine   Date Value Ref Range Status   07/09/2019 0.57 (L) 0.70 - 1.30 mg/dL Final   16/10/9602 5.40 (L) 0.70 - 1.30 mg/dL Final   98/11/9145 8.29 (L) 0.70 - 1.30 mg/dL Final        Pharmacokinetic Considerations and Significant Drug Interactions:  ??? Adult (estimated initial): Vd = 67.59 L, ke = 0.0903 hr-1  ??? Concurrent nephrotoxic meds: not applicable    Assessment/Plan:  Recommendation(s)  ??? Start vancomycin 1750mg  q12hr  ??? Estimated trough on recommended regimen: 15 mg/L    Follow-up  ??? Level due: prior to fourth or fifth dose  ??? A pharmacist will continue to monitor and order levels as appropriate    Please page service pharmacist with questions/clarifications.    Maryln Manuel, PharmD

## 2019-07-10 NOTE — Unmapped (Signed)
[facility] - Operative Note     VIR Post-Procedure Note    Procedure Name: SL PICC Placement     Pre-Op Diagnosis: Osteomyelitis     Post-Op Diagnosis: Same as pre-operative diagnosis    VIR Providers    Operator: Burney Gauze, NP-C     Description of procedure: Successful placement of right basilic 47cm 42F SL PICC    Estimated Blood Loss: approximately 0 mL  Complications: None    See detailed procedure note with images in PACS ([facility]).    The patient tolerated the procedure well without incident or complication and left the room in stable condition.    Burney Gauze, FNP-C   07/10/2019 11:54 AM

## 2019-07-10 NOTE — Unmapped (Signed)
Date of Surgery: 07/09/2019  ??  Pre-op Diagnosis: Osteomyelitis of the right heel   ??  Post-op Diagnosis: same  ??  Procedure(s):  INCISION, BONE CORTEX, FOOT:   APPLY GRAFT FACE/SCALP/EYE/MOUTH/NECK/EAR/ORBIT/GENITAL/HAND/FEET/MULT DGT, AREA TO 100 SQ CM; 1ST 25 SQ CM:   Note: Revisions to procedures should be made in chart - see Procedures activity.  ??  Performing Service: Vascular  Surgeon(s) and Role:     * Karen Chafe, DPM - Primary  ??  Assistant: None  ??  Findings: Necrotic fat to level of calcaneous including bone and periosteal tissue. Improved with resection and debridement of plantar calcaneous until healthy bleeding was appreciated.   ??  Anesthesia: Preop Block + Monitor Anesthesia Care  ??  Estimated Blood Loss: 25 mL  ??  Complications: None  ??  Specimens:   ID Type Source Tests Collected by Time Destination   1 : Right calcaneus  Bone Foot, Right SURGICAL PATHOLOGY EXAM Margarito Liner [place], DPM 07/09/2019 1441 ??   A : Right calcaneus  Bone Foot, Right AEROBIC/ANAEROBIC CULTURE, AFB CULTURE Margarito Liner [place], DPM 07/09/2019 1441 ??   ??  ??  Implants:   Implant Name Type Inv. Item Serial No. Manufacturer Lot No. LRB No. Used Action   [employer]OGRAFT SM - [id number] Graft [employer] SM ?? LIFENET [id number] Right 1 Implanted   ??  ??  Surgeon Notes: I was present and scrubbed for the entire procedure  ??      This is Michael [name]  84 y.o. male, coming in today for a surgical intervention of the right foot.  A preoperative discussion had occurred with the risk complications postoperative course as well as alternatives.  Patient had the opportunity to ask questions and have them answered appropriately.  Patient opts to proceed with the surgical intervention at this time.    Report of operation  Patient was brought to the operating room properly identified placed in the operative room table in supine position following administration of sedation Attention was directed towards the right foot where was scrubbed prepped and draped usual sterile and aseptic manner    After a second timeout was performed surgery was initiated    Attention was directed towards the right plantar ulceration at this time.      15 blade was utilized to create a triangle type incision down to subcutaneous tissue.  Electrocautery was utilized and the wound was noted in toto.  The subcutaneous fat was necrotic and had shown liquefactive necrosis.  This is found on the down the periosteal tissues of plantar aspect of the right calcaneus.  At this time a significant amount of necrosis of fat was removed at this time.  With scissors, scalpel, curettes until healthy viable surrounding subcutaneous tissue was appreciated.  Next the process of periosteal tissues at this time.  Where there was abnormal human contour or texture of the plantar aspect calcaneus therefore utilized a rongeur and a curette at this time no significant healthy bone of the plantar aspect of the calcaneus.  Until healthy bleeding viable bone was appreciated this was also done for cultural pathological evaluation. At this time we utilize the Misonix SonicOne vac at 7 intensity and 70% water to debride further all non-viable tissue of tissue that has not demarcated yet and was necrosed without showing the devitalized state due to its non-viability. This included the Subcutaneous tissue, muscle,  Capsule and bone.  Once healthy periosteal tissues was appreciated and healthy  subcutaneous tissue I applied a TheraSkin following the package insert at this time for directions for application and thawing.  This was held in place with a 4-0 chromic on a 3-0 Vicryl.  The care of this significant soft tissue defect measured 2.5 x 1.5 x 2.0 cm.     Then I placed a Adaptic stapled in place over the foot.  4 x 4's ABD Kerlix and Ace wrap to the right lower extremity.    This is a staged procedure in which future surgical debridements and/or amputation may be indicated.     The comorbidities and barriers to healing was discussed with the patient prior to surgical intervention.

## 2019-07-10 NOTE — Unmapped (Signed)
VSS, a&o x4. Pt denies pain/discomfort. Pt transitioned to reg diet after R SL PICC placement today. Per orders currently non weight bearing on RLE. No acute events during shift, wctm.    Problem: Adult Inpatient Plan of Care  Goal: Plan of Care Review  Outcome: Ongoing - Unchanged  Goal: Patient-Specific Goal (Individualization)  Outcome: Ongoing - Unchanged  Goal: Absence of Hospital-Acquired Illness or Injury  Outcome: Ongoing - Unchanged  Goal: Optimal Comfort and Wellbeing  Outcome: Ongoing - Unchanged  Goal: Readiness for Transition of Care  Outcome: Ongoing - Unchanged  Goal: Rounds/Family Conference  Outcome: Ongoing - Unchanged     Problem: Fall Injury Risk  Goal: Absence of Fall and Fall-Related Injury  Outcome: Ongoing - Unchanged     Problem: Skin Injury Risk Increased  Goal: Skin Health and Integrity  Outcome: Ongoing - Unchanged

## 2019-07-10 NOTE — Unmapped (Signed)
There have been no history and physical interval changes.  ASA 3, Airway 3.

## 2019-07-11 LAB — BASIC METABOLIC PANEL
ANION GAP: 3 mmol/L — ABNORMAL LOW (ref 7–15)
BLOOD UREA NITROGEN: 16 mg/dL (ref 7–21)
BUN / CREAT RATIO: 30
CHLORIDE: 106 mmol/L (ref 98–107)
CO2: 28 mmol/L (ref 22.0–30.0)
CREATININE: 0.54 mg/dL — ABNORMAL LOW (ref 0.70–1.30)
EGFR CKD-EPI AA MALE: >90 mL/min/{1.73_m2} (ref >=60)
EGFR CKD-EPI NON-AA MALE: >90 mL/min/{1.73_m2} (ref >=60)
GLUCOSE RANDOM: 115 mg/dL (ref 70–179)
POTASSIUM: 4.2 mmol/L (ref 3.5–5.0)
SODIUM: 137 mmol/L (ref 135–145)

## 2019-07-11 LAB — VANCOMYCIN RANDOM: Vancomycin^random:MCnc:Pt:Ser/Plas:Qn:: 16.3

## 2019-07-11 LAB — CBC
HEMATOCRIT: 30.7 % — ABNORMAL LOW (ref 41.0–53.0)
HEMOGLOBIN: 10 g/dL — ABNORMAL LOW (ref 13.5–17.5)
MEAN CORPUSCULAR HEMOGLOBIN: 32.3 pg (ref 26.0–34.0)
MEAN CORPUSCULAR VOLUME: 99.4 fL (ref 80.0–100.0)
MEAN PLATELET VOLUME: 8.6 fL (ref 7.0–10.0)
PLATELET COUNT: 286 10*9/L (ref 150–440)
RED BLOOD CELL COUNT: 3.08 10*12/L — ABNORMAL LOW (ref 4.50–5.90)
RED CELL DISTRIBUTION WIDTH: 14 % (ref 12.0–15.0)
WBC ADJUSTED: 4 10*9/L — ABNORMAL LOW (ref 4.5–11.0)

## 2019-07-11 LAB — MAGNESIUM: Magnesium:MCnc:Pt:Ser/Plas:Qn:: 1.7

## 2019-07-11 LAB — PHOSPHORUS: Phosphate:MCnc:Pt:Ser/Plas:Qn:: 2.8 — ABNORMAL LOW

## 2019-07-11 LAB — WBC ADJUSTED: Leukocytes:NCnc:Pt:Bld:Qn:: 4 — ABNORMAL LOW

## 2019-07-11 LAB — POTASSIUM: Potassium:SCnc:Pt:Ser/Plas:Qn:: 4.2

## 2019-07-11 MED ADMIN — calcium carbonate (OS-CAL) tablet 600 mg of elem calcium: 600 mg | ORAL | @ 13:00:00

## 2019-07-11 MED ADMIN — aspirin chewable tablet 81 mg: 81 mg | ORAL | @ 13:00:00

## 2019-07-11 MED ADMIN — vancomycin (VANCOCIN) 1750 mg in sodium chloride (NS) 0.9 % 500 mL IVPB (premix): 1750 mg | INTRAVENOUS | @ 23:00:00 | Stop: 2019-07-23

## 2019-07-11 MED ADMIN — furosemide (LASIX) tablet 20 mg: 20 mg | ORAL | @ 13:00:00

## 2019-07-11 MED ADMIN — levothyroxine (SYNTHROID) tablet 100 mcg: 100 ug | ORAL | @ 09:00:00

## 2019-07-11 MED ADMIN — metroNIDAZOLE (FLAGYL) tablet 500 mg: 500 mg | ORAL | Stop: 2019-07-23

## 2019-07-11 MED ADMIN — heparin, porcine (PF) 100 unit/mL injection 200 Units: 200 [IU] | INTRAVENOUS | @ 09:00:00

## 2019-07-11 MED ADMIN — metroNIDAZOLE (FLAGYL) tablet 500 mg: 500 mg | ORAL | @ 13:00:00 | Stop: 2019-07-23

## 2019-07-11 MED ADMIN — magnesium sulfate 2gm/50mL IVPB: 2 g | INTRAVENOUS | @ 13:00:00 | Stop: 2019-07-11

## 2019-07-11 MED ADMIN — cholecalciferol (vitamin D3 25 mcg (1,000 units)) tablet 12.5 mcg: 12.5 ug | ORAL | @ 13:00:00

## 2019-07-11 MED ADMIN — calcium carbonate (OS-CAL) tablet 600 mg of elem calcium: 600 mg | ORAL

## 2019-07-11 MED ADMIN — pravastatin (PRAVACHOL) tablet 40 mg: 40 mg | ORAL

## 2019-07-11 MED ADMIN — enzalutamide (XTANDI) capsule 120 mg **Patient Home Med**: 120 mg | ORAL | @ 15:00:00
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
## 2019-07-11 NOTE — Unmapped (Signed)
Vancomycin Therapeutic Monitoring Pharmacy Note    Michael [name] is a 84 y.o. male starting vancomycin. Date of therapy initiation: 07/09/19    Indication: Osteomyelitis    Prior Dosing Information: Current regimen 1750 mg q12h IV      Goals:  Therapeutic Drug Levels  Vancomycin trough goal: 15-20 mg/L    Additional Clinical Monitoring/Outcomes  Renal function, volume status (intake and output)    Results: Vancomycin level: 16.3 mg/L, drawn 1 hour early (true trough 15 mg/L)    Wt Readings from Last 1 Encounters:   07/07/19 95.2 kg (209 lb 14.4 oz)     Creatinine   Date Value Ref Range Status   07/11/2019 0.54 (L) 0.70 - 1.30 mg/dL Final   16/10/9602 5.40 (L) 0.70 - 1.30 mg/dL Final   98/11/9145 8.29 (L) 0.70 - 1.30 mg/dL Final        Pharmacokinetic Considerations and Significant Drug Interactions:  ??? Adult (calculated on 6/26): Vd = 72.35 L, ke = 0.0840 hr-1  ??? Concurrent nephrotoxic meds: not applicable    Assessment/Plan:  Recommendation(s)  ??? Continue current regimen of vancomycin 1750 mg q12h  ??? Estimated trough on recommended regimen: 15 mg/L    Follow-up  ??? Level due: prior to fourth or fifth dose  ??? A pharmacist will continue to monitor and order levels as appropriate    Please page service pharmacist with questions/clarifications.    Johny Shears, PharmD
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
## 2019-07-11 NOTE — Unmapped (Addendum)
No acute events this shift, VSS, O2 SATs maintained on RA, A/Ox4, surgical dressing to the RLE is c/d/I, vascular system WDN, denies pain, numbness to the RLE but report full sensation, PO intake adequate UOP with episodes of incontinency noted, continue on IV abx, vanc trough due for draws this morning, per orders, contact precaution maintained, safety measures intact, will continue to monitor closely.  1610 Pharmacy confirm okay to give AM vac, the trough levels were WDL    Problem: Adult Inpatient Plan of Care  Goal: Plan of Care Review  Outcome: Progressing  Goal: Patient-Specific Goal (Individualization)  Outcome: Progressing  Goal: Absence of Hospital-Acquired Illness or Injury  Outcome: Progressing  Goal: Optimal Comfort and Wellbeing  Outcome: Progressing  Goal: Readiness for Transition of Care  Outcome: Progressing  Goal: Rounds/Family Conference  Outcome: Progressing     Problem: Fall Injury Risk  Goal: Absence of Fall and Fall-Related Injury  Outcome: Progressing     Problem: Skin Injury Risk Increased  Goal: Skin Health and Integrity  Outcome: Progressing     Problem: Wound  Goal: Optimal Wound Healing  Outcome: Progressing     Problem: Self-Care Deficit  Goal: Improved Ability to Complete Activities of Daily Living  Outcome: Progressing     Problem: Diabetes Comorbidity  Goal: Blood Glucose Level Within Desired Range  Outcome: Progressing     Problem: Infection  Goal: Infection Symptom Resolution  Outcome: Progressing

## 2019-07-12 LAB — BASIC METABOLIC PANEL
ANION GAP: 2 mmol/L — ABNORMAL LOW (ref 7–15)
BUN / CREAT RATIO: 28
CALCIUM: 8.1 mg/dL — ABNORMAL LOW (ref 8.5–10.2)
CHLORIDE: 107 mmol/L (ref 98–107)
CO2: 28 mmol/L (ref 22.0–30.0)
CREATININE: 0.58 mg/dL — ABNORMAL LOW (ref 0.70–1.30)
EGFR CKD-EPI AA MALE: >90 mL/min/{1.73_m2} (ref >=60)
EGFR CKD-EPI NON-AA MALE: >90 mL/min/{1.73_m2} (ref >=60)
GLUCOSE RANDOM: 97 mg/dL (ref 70–179)
POTASSIUM: 4.1 mmol/L (ref 3.5–5.0)
SODIUM: 137 mmol/L (ref 135–145)

## 2019-07-12 LAB — VANCOMYCIN, RANDOM: VANCOMYCIN RANDOM: 18.9 ug/mL

## 2019-07-12 LAB — CBC
HEMATOCRIT: 31.7 % — ABNORMAL LOW (ref 41.0–53.0)
HEMOGLOBIN: 10.2 g/dL — ABNORMAL LOW (ref 13.5–17.5)
MEAN CORPUSCULAR HEMOGLOBIN CONC: 32.2 g/dL (ref 31.0–37.0)
MEAN CORPUSCULAR VOLUME: 99.6 fL (ref 80.0–100.0)
MEAN PLATELET VOLUME: 8.6 fL (ref 7.0–10.0)
PLATELET COUNT: 296 10*9/L (ref 150–440)
RED BLOOD CELL COUNT: 3.18 10*12/L — ABNORMAL LOW (ref 4.50–5.90)
RED CELL DISTRIBUTION WIDTH: 14.1 % (ref 12.0–15.0)
WBC ADJUSTED: 3.3 10*9/L — ABNORMAL LOW (ref 4.5–11.0)

## 2019-07-12 LAB — CALCIUM: Calcium:MCnc:Pt:Ser/Plas:Qn:: 8.1 — ABNORMAL LOW

## 2019-07-12 LAB — VANCOMYCIN RANDOM
Vancomycin^random:MCnc:Pt:Ser/Plas:Qn:: 18.9
Vancomycin^random:MCnc:Pt:Ser/Plas:Qn:: 28.5

## 2019-07-12 LAB — RED CELL DISTRIBUTION WIDTH: Lab: 14.1

## 2019-07-12 MED ADMIN — calcium carbonate (OS-CAL) tablet 600 mg of elem calcium: 600 mg | ORAL | @ 12:00:00

## 2019-07-12 MED ADMIN — levothyroxine (SYNTHROID) tablet 100 mcg: 100 ug | ORAL | @ 09:00:00

## 2019-07-12 MED ADMIN — vancomycin (VANCOCIN) 1750 mg in sodium chloride (NS) 0.9 % 500 mL IVPB (premix): 1750 mg | INTRAVENOUS | @ 10:00:00 | Stop: 2019-07-12

## 2019-07-12 MED ADMIN — enoxaparin (LOVENOX) syringe 40 mg: 40 mg | SUBCUTANEOUS | @ 22:00:00

## 2019-07-12 MED ADMIN — cefTRIAXone (ROCEPHIN) 2 g in sodium chloride 0.9 % (NS) 100 mL IVPB-connector bag: 2 g | INTRAVENOUS | @ 02:00:00 | Stop: 2019-07-22

## 2019-07-12 MED ADMIN — metroNIDAZOLE (FLAGYL) tablet 500 mg: 500 mg | ORAL | @ 12:00:00 | Stop: 2019-07-23

## 2019-07-12 MED ADMIN — cholecalciferol (vitamin D3 25 mcg (1,000 units)) tablet 12.5 mcg: 12.5 ug | ORAL | @ 12:00:00

## 2019-07-12 MED ADMIN — metroNIDAZOLE (FLAGYL) tablet 500 mg: 500 mg | ORAL | @ 18:00:00 | Stop: 2019-07-23

## 2019-07-12 MED ADMIN — furosemide (LASIX) tablet 20 mg: 20 mg | ORAL | @ 12:00:00

## 2019-07-12 MED ADMIN — gabapentin (NEURONTIN) capsule 300 mg: 300 mg | ORAL | @ 02:00:00

## 2019-07-12 MED ADMIN — metroNIDAZOLE (FLAGYL) tablet 500 mg: 500 mg | ORAL | @ 02:00:00 | Stop: 2019-07-23

## 2019-07-12 MED ADMIN — aspirin chewable tablet 81 mg: 81 mg | ORAL | @ 12:00:00

## 2019-07-12 NOTE — Unmapped (Signed)
VSS. Denies any pain. R foot dressing clean, dry, and intact. IV abx given. Turns every 2 hours independently. Incontinent of urine and stool.      Problem: Adult Inpatient Plan of Care  Goal: Plan of Care Review  Outcome: Ongoing - Unchanged  Goal: Patient-Specific Goal (Individualization)  Outcome: Ongoing - Unchanged  Goal: Absence of Hospital-Acquired Illness or Injury  Outcome: Ongoing - Unchanged  Goal: Optimal Comfort and Wellbeing  Outcome: Ongoing - Unchanged  Goal: Readiness for Transition of Care  Outcome: Ongoing - Unchanged  Goal: Rounds/Family Conference  Outcome: Ongoing - Unchanged     Problem: Fall Injury Risk  Goal: Absence of Fall and Fall-Related Injury  Outcome: Ongoing - Unchanged     Problem: Skin Injury Risk Increased  Goal: Skin Health and Integrity  Outcome: Ongoing - Unchanged     Problem: Wound  Goal: Optimal Wound Healing  Outcome: Ongoing - Unchanged     Problem: Self-Care Deficit  Goal: Improved Ability to Complete Activities of Daily Living  Outcome: Ongoing - Unchanged     Problem: Diabetes Comorbidity  Goal: Blood Glucose Level Within Desired Range  Outcome: Ongoing - Unchanged     Problem: Infection  Goal: Infection Symptom Resolution  Outcome: Ongoing - Unchanged

## 2019-07-12 NOTE — Unmapped (Signed)
Afebrile, vital signs stable, denies pain, nausea, vomiting, and diarrhea.  Continues to turn self every 2 hours, just needs to be reminded. Falls and Contact precautions  maintained, bed alarm on. Call bell within reached, RLE dressing changed.       Problem: Adult Inpatient Plan of Care  Goal: Plan of Care Review  Outcome: Ongoing - Unchanged  Goal: Patient-Specific Goal (Individualization)  Outcome: Ongoing - Unchanged  Goal: Absence of Hospital-Acquired Illness or Injury  Outcome: Ongoing - Unchanged  Goal: Optimal Comfort and Wellbeing  Outcome: Ongoing - Unchanged  Goal: Readiness for Transition of Care  Outcome: Ongoing - Unchanged  Goal: Rounds/Family Conference  Outcome: Ongoing - Unchanged

## 2019-07-12 NOTE — Unmapped (Signed)
Pt is alert and oriented x4. He is waiting on SNF placement. He is no touch down on rt let after surgery on his rt foot. His foot was cleansed with Dakins and saline. Guaze and abd pad applied and wrapped with an ace wrap. Pt is incontinent of urine and stool. He is turned every 2 hours. He has a mepilex.  on is lower back. Pt is receiving IV antibiotics.   Problem: Adult Inpatient Plan of Care  Goal: Plan of Care Review  Outcome: Ongoing - Unchanged  Goal: Patient-Specific Goal (Individualization)  Outcome: Ongoing - Unchanged  Goal: Absence of Hospital-Acquired Illness or Injury  Outcome: Ongoing - Unchanged  Goal: Optimal Comfort and Wellbeing  Outcome: Ongoing - Unchanged  Goal: Readiness for Transition of Care  Outcome: Ongoing - Unchanged  Goal: Rounds/Family Conference  Outcome: Ongoing - Unchanged

## 2019-07-12 NOTE — Unmapped (Signed)
Vancomycin Therapeutic Monitoring Pharmacy Note    Michael [name] is a 84 y.o. male starting vancomycin. Date of therapy initiation: 07/09/19    Indication: Osteomyelitis    Prior Dosing Information: Current regimen 1750 mg q12h IV      Goals:  Therapeutic Drug Levels  Vancomycin trough goal: 15-20 mg/L    Additional Clinical Monitoring/Outcomes  Renal function, volume status (intake and output)    Results: Vancomycin level 28.5 mg/L, drawn appropriately    Wt Readings from Last 1 Encounters:   07/07/19 95.2 kg (209 lb 14.4 oz)     Creatinine   Date Value Ref Range Status   07/12/2019 0.58 (L) 0.70 - 1.30 mg/dL Final   16/10/9602 5.40 (L) 0.70 - 1.30 mg/dL Final   98/11/9145 8.29 (L) 0.70 - 1.30 mg/dL Final        Pharmacokinetic Considerations and Significant Drug Interactions:  ??? Adult (calculated on 6/27): Vd = 72.35 L, ke = 0.0512 hr-1  ??? Concurrent nephrotoxic meds: not applicable    Assessment/Plan:  Recommendation(s)  ??? Change current regimen to vancomycin 1000 mg q12h - start 6 hours after next level due so that within goal range of 15-20 mg/L prior to re-dosing.  ??? Estimated trough on recommended regimen: 15 mg/L    Follow-up  ??? Level due: Will get level at 1800 on 6/27 to ensure vancomycin is not further accumulating  ??? A pharmacist will continue to monitor and order levels as appropriate    Please page service pharmacist with questions/clarifications.    Johny Shears, PharmD
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
## 2019-07-13 LAB — CBC
HEMOGLOBIN: 10.1 g/dL — ABNORMAL LOW (ref 13.5–17.5)
MEAN CORPUSCULAR HEMOGLOBIN CONC: 32.8 g/dL (ref 31.0–37.0)
MEAN CORPUSCULAR HEMOGLOBIN: 32.7 pg (ref 26.0–34.0)
MEAN CORPUSCULAR VOLUME: 99.6 fL (ref 80.0–100.0)
MEAN PLATELET VOLUME: 8.2 fL (ref 7.0–10.0)
PLATELET COUNT: 311 10*9/L (ref 150–440)
RED BLOOD CELL COUNT: 3.1 10*12/L — ABNORMAL LOW (ref 4.50–5.90)
RED CELL DISTRIBUTION WIDTH: 14.9 % (ref 12.0–15.0)

## 2019-07-13 LAB — BASIC METABOLIC PANEL
ANION GAP: 1 mmol/L — ABNORMAL LOW (ref 7–15)
BLOOD UREA NITROGEN: 14 mg/dL (ref 7–21)
BUN / CREAT RATIO: 27
CALCIUM: 8.1 mg/dL — ABNORMAL LOW (ref 8.5–10.2)
CHLORIDE: 109 mmol/L — ABNORMAL HIGH (ref 98–107)
CO2: 27 mmol/L (ref 22.0–30.0)
CREATININE: 0.51 mg/dL — ABNORMAL LOW (ref 0.70–1.30)
EGFR CKD-EPI NON-AA MALE: >90 mL/min/{1.73_m2} (ref >=60)
GLUCOSE RANDOM: 108 mg/dL (ref 70–179)
POTASSIUM: 3.8 mmol/L (ref 3.5–5.0)
SODIUM: 137 mmol/L (ref 135–145)

## 2019-07-13 LAB — RED CELL DISTRIBUTION WIDTH: Lab: 14.9

## 2019-07-13 LAB — CREATININE: Creatinine:MCnc:Pt:Ser/Plas:Qn:: 0.51 — ABNORMAL LOW

## 2019-07-13 MED ADMIN — calcium carbonate (OS-CAL) tablet 600 mg of elem calcium: 600 mg | ORAL | @ 14:00:00

## 2019-07-13 MED ADMIN — gabapentin (NEURONTIN) capsule 300 mg: 300 mg | ORAL

## 2019-07-13 MED ADMIN — furosemide (LASIX) tablet 20 mg: 20 mg | ORAL | @ 14:00:00

## 2019-07-13 MED ADMIN — vancomycin (VANCOCIN) 1500 mg in sodium chloride (NS) 0.9 % 500 mL IVPB (premix): 1500 mg | INTRAVENOUS | @ 14:00:00 | Stop: 2019-07-22

## 2019-07-13 MED ADMIN — calcium carbonate (OS-CAL) tablet 600 mg of elem calcium: 600 mg | ORAL

## 2019-07-13 MED ADMIN — enoxaparin (LOVENOX) syringe 40 mg: 40 mg | SUBCUTANEOUS | @ 22:00:00

## 2019-07-13 MED ADMIN — cholecalciferol (vitamin D3 25 mcg (1,000 units)) tablet 12.5 mcg: 12.5 ug | ORAL | @ 14:00:00

## 2019-07-13 MED ADMIN — metroNIDAZOLE (FLAGYL) tablet 500 mg: 500 mg | ORAL | @ 17:00:00 | Stop: 2019-07-23

## 2019-07-13 MED ADMIN — cholecalciferol (vitamin D3 25 mcg (1,000 units)) tablet 12.5 mcg: 12.5 ug | ORAL

## 2019-07-13 MED ADMIN — metroNIDAZOLE (FLAGYL) tablet 500 mg: 500 mg | ORAL | Stop: 2019-07-23
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
## 2019-07-13 NOTE — Unmapped (Signed)
VSS; afebrile overnight, no acute event. Pt vancomycin level 18.9, Vanc dose given as ordered. Pt had 1 BM overnight.       Problem: Adult Inpatient Plan of Care  Goal: Plan of Care Review  Outcome: Progressing  Goal: Patient-Specific Goal (Individualization)  Outcome: Progressing  Goal: Absence of Hospital-Acquired Illness or Injury  Outcome: Progressing  Goal: Optimal Comfort and Wellbeing  Outcome: Progressing  Goal: Readiness for Transition of Care  Outcome: Progressing  Goal: Rounds/Family Conference  Outcome: Progressing     Problem: Fall Injury Risk  Goal: Absence of Fall and Fall-Related Injury  Outcome: Progressing     Problem: Skin Injury Risk Increased  Goal: Skin Health and Integrity  Outcome: Progressing     Problem: Wound  Goal: Optimal Wound Healing  Outcome: Progressing     Problem: Self-Care Deficit  Goal: Improved Ability to Complete Activities of Daily Living  Outcome: Progressing     Problem: Diabetes Comorbidity  Goal: Blood Glucose Level Within Desired Range  Outcome: Progressing     Problem: Infection  Goal: Infection Symptom Resolution  Outcome: Progressing

## 2019-07-13 NOTE — Unmapped (Signed)
Vancomycin Therapeutic Monitoring Pharmacy Note    Michael [name] is a 84 y.o. male continuing vancomycin. Date of therapy initiation: 07/09/19    Indication: Osteomyelitis     Prior Dosing Information: Current regimen 1750 mg q12h IV      Goals:  Therapeutic Drug Levels  Vancomycin trough goal: 15-20 mg/L    Additional Clinical Monitoring/Outcomes  Renal function, volume status (intake and output)    Results: Vancomycin level 18.9 mg/L on 07/12/19 1800 (random level) after supratherapeutic level of 28.5 mg/L on 07/12/19 0541    Wt Readings from Last 1 Encounters:   07/07/19 95.2 kg (209 lb 14.4 oz)     Creatinine   Date Value Ref Range Status   07/12/2019 0.58 (L) 0.70 - 1.30 mg/dL Final   29/56/2130 8.65 (L) 0.70 - 1.30 mg/dL Final   78/46/9629 5.28 (L) 0.70 - 1.30 mg/dL Final        Pharmacokinetic Considerations and Significant Drug Interactions:  ??? Adult (calculated on 6/27): Vd = 72.35 L, ke = 0.0512 hr-1  ??? Concurrent nephrotoxic meds: not applicable    Assessment/Plan:  Recommendation(s)  ??? Change current regimen to vancomycin 1500 mg Q12H  - previous vancomycin level was likely incorrect due to rapid fall to therapeutic range 12 hours later. Resume vancomycin at 1500 mg Q12H - dose reduced for accumulation  ??? Estimated trough on recommended regimen: 15 mg/L    Follow-up  ??? Level due: prior to fourth or fifth dose  ??? A pharmacist will continue to monitor and order levels as appropriate    Please page service pharmacist with questions/clarifications.    Wilfrid Lund, PharmD   PGY1 Pharmacy Resident

## 2019-07-14 LAB — CBC
HEMOGLOBIN: 10 g/dL — ABNORMAL LOW (ref 13.5–17.5)
MEAN CORPUSCULAR HEMOGLOBIN CONC: 32.3 g/dL (ref 31.0–37.0)
MEAN CORPUSCULAR HEMOGLOBIN: 32.3 pg (ref 26.0–34.0)
MEAN CORPUSCULAR VOLUME: 99.9 fL (ref 80.0–100.0)
PLATELET COUNT: 287 10*9/L (ref 150–440)
RED BLOOD CELL COUNT: 3.1 10*12/L — ABNORMAL LOW (ref 4.50–5.90)
RED CELL DISTRIBUTION WIDTH: 14.3 % (ref 12.0–15.0)
WBC ADJUSTED: 4 10*9/L — ABNORMAL LOW (ref 4.5–11.0)

## 2019-07-14 LAB — BASIC METABOLIC PANEL
ANION GAP: 2 mmol/L — ABNORMAL LOW (ref 7–15)
BLOOD UREA NITROGEN: 16 mg/dL (ref 7–21)
BUN / CREAT RATIO: 30
CALCIUM: 8.4 mg/dL — ABNORMAL LOW (ref 8.5–10.2)
CHLORIDE: 108 mmol/L — ABNORMAL HIGH (ref 98–107)
CO2: 28 mmol/L (ref 22.0–30.0)
CREATININE: 0.53 mg/dL — ABNORMAL LOW (ref 0.70–1.30)
EGFR CKD-EPI AA MALE: >90 mL/min/{1.73_m2} (ref >=60)
EGFR CKD-EPI NON-AA MALE: >90 mL/min/{1.73_m2} (ref >=60)
GLUCOSE RANDOM: 101 mg/dL (ref 70–179)
SODIUM: 138 mmol/L (ref 135–145)

## 2019-07-14 LAB — VANCOMYCIN TROUGH: Vancomycin^trough:MCnc:Pt:Ser/Plas:Qn:: 22.2

## 2019-07-14 LAB — EGFR CKD-EPI AA MALE: Glomerular filtration rate/1.73 sq M.predicted.black:ArVRat:Pt:Ser/Plas/Bld:Qn:Creatinine-based formula (CKD-EPI): >90

## 2019-07-14 LAB — RED BLOOD CELL COUNT: Lab: 3.1 — ABNORMAL LOW

## 2019-07-14 MED ADMIN — cholecalciferol (vitamin D3 25 mcg (1,000 units)) tablet 12.5 mcg: 12.5 ug | ORAL | @ 01:00:00

## 2019-07-14 MED ADMIN — calcium carbonate (OS-CAL) tablet 600 mg of elem calcium: 600 mg | ORAL | @ 01:00:00

## 2019-07-14 MED ADMIN — gabapentin (NEURONTIN) capsule 300 mg: 300 mg | ORAL | @ 01:00:00

## 2019-07-14 MED ADMIN — enzalutamide (XTANDI) capsule 120 mg **Patient Home Med**: 120 mg | ORAL | @ 12:00:00

## 2019-07-14 MED ADMIN — enoxaparin (LOVENOX) syringe 40 mg: 40 mg | SUBCUTANEOUS | @ 22:00:00

## 2019-07-14 MED ADMIN — metroNIDAZOLE (FLAGYL) tablet 500 mg: 500 mg | ORAL | @ 01:00:00 | Stop: 2019-07-23

## 2019-07-14 MED ADMIN — furosemide (LASIX) tablet 20 mg: 20 mg | ORAL | @ 12:00:00

## 2019-07-14 MED ADMIN — aspirin chewable tablet 81 mg: 81 mg | ORAL | @ 12:00:00

## 2019-07-14 MED ADMIN — levothyroxine (SYNTHROID) tablet 100 mcg: 100 ug | ORAL | @ 10:00:00

## 2019-07-14 MED ADMIN — cholecalciferol (vitamin D3 25 mcg (1,000 units)) tablet 12.5 mcg: 12.5 ug | ORAL | @ 12:00:00

## 2019-07-14 MED ADMIN — calcium carbonate (OS-CAL) tablet 600 mg of elem calcium: 600 mg | ORAL | @ 12:00:00

## 2019-07-14 MED ADMIN — pravastatin (PRAVACHOL) tablet 40 mg: 40 mg | ORAL | @ 01:00:00

## 2019-07-14 MED ADMIN — metroNIDAZOLE (FLAGYL) tablet 500 mg: 500 mg | ORAL | @ 12:00:00 | Stop: 2019-07-23

## 2019-07-14 NOTE — Unmapped (Addendum)
Vancomycin Therapeutic Monitoring Pharmacy Note    Michael [name] is a 84 y.o. male continuing vancomycin. Date of therapy initiation: 07/09/19    Indication: Osteomyelitis     Prior Dosing Information: Current regimen 1500 mg q12h IV      Goals:  Therapeutic Drug Levels  Vancomycin trough goal: 15-20 mg/L    Additional Clinical Monitoring/Outcomes  Renal function, volume status (intake and output)    Results: Vancomycin level 22.2 mg/L on 07/14/19 ~0830, which was ~10 hours after start of last dose. True Trough ~20.5 mg/L    Wt Readings from Last 1 Encounters:   07/07/19 95.2 kg (209 lb 14.4 oz)     Creatinine   Date Value Ref Range Status   07/14/2019 0.53 (L) 0.70 - 1.30 mg/dL Final   16/10/9602 5.40 (L) 0.70 - 1.30 mg/dL Final   98/11/9145 8.29 (L) 0.70 - 1.30 mg/dL Final        Pharmacokinetic Considerations and Significant Drug Interactions:  ??? Adult (calculated on 6/29): Vd = 76.67 L, ke = 0.0588 hr-1  ??? Concurrent nephrotoxic meds: not applicable    Assessment/Plan:  Recommendation(s)  ??? Change current regimen to vancomycin 1250 mg Q12H   ??? Estimated trough on recommended regimen: 16 mg/L    Follow-up  ??? Level due: prior to fourth or fifth dose  ??? A pharmacist will continue to monitor and order levels as appropriate    Please page service pharmacist with questions/clarifications.    [place] M Amanada Philbrick, PharmD

## 2019-07-14 NOTE — Unmapped (Signed)
Michael [name] is alert and oriented x4, afebrile with stable VS. Pt with no c/o pain or nausea. RLE dressing changed this morning by provider. Pt with active bowel and good urine output, incontinent of both with condom cath in place. To XR this afternoon. Fall precautions and pt safety maintained. Emotional and educational support provided. Will continue to monitor.     Problem: Adult Inpatient Plan of Care  Goal: Plan of Care Review  Outcome: Ongoing - Unchanged  Goal: Patient-Specific Goal (Individualization)  Outcome: Ongoing - Unchanged  Goal: Absence of Hospital-Acquired Illness or Injury  Outcome: Ongoing - Unchanged  Goal: Optimal Comfort and Wellbeing  Outcome: Ongoing - Unchanged  Goal: Readiness for Transition of Care  Outcome: Ongoing - Unchanged  Goal: Rounds/Family Conference  Outcome: Ongoing - Unchanged     Problem: Fall Injury Risk  Goal: Absence of Fall and Fall-Related Injury  Outcome: Ongoing - Unchanged     Problem: Skin Injury Risk Increased  Goal: Skin Health and Integrity  Outcome: Ongoing - Unchanged     Problem: Wound  Goal: Optimal Wound Healing  Outcome: Ongoing - Unchanged     Problem: Self-Care Deficit  Goal: Improved Ability to Complete Activities of Daily Living  Outcome: Ongoing - Unchanged     Problem: Diabetes Comorbidity  Goal: Blood Glucose Level Within Desired Range  Outcome: Ongoing - Unchanged     Problem: Infection  Goal: Infection Symptom Resolution  Outcome: Ongoing - Unchanged

## 2019-07-14 NOTE — Unmapped (Signed)
VSS; uneventful PM shift. Pt had 1 BM overnight.       Problem: Adult Inpatient Plan of Care  Goal: Plan of Care Review  Outcome: Progressing  Goal: Patient-Specific Goal (Individualization)  Outcome: Progressing  Goal: Absence of Hospital-Acquired Illness or Injury  Outcome: Progressing  Goal: Optimal Comfort and Wellbeing  Outcome: Progressing  Goal: Readiness for Transition of Care  Outcome: Progressing  Goal: Rounds/Family Conference  Outcome: Progressing     Problem: Fall Injury Risk  Goal: Absence of Fall and Fall-Related Injury  Outcome: Progressing     Problem: Skin Injury Risk Increased  Goal: Skin Health and Integrity  Outcome: Progressing     Problem: Wound  Goal: Optimal Wound Healing  Outcome: Progressing     Problem: Self-Care Deficit  Goal: Improved Ability to Complete Activities of Daily Living  Outcome: Progressing     Problem: Diabetes Comorbidity  Goal: Blood Glucose Level Within Desired Range  Outcome: Progressing     Problem: Infection  Goal: Infection Symptom Resolution  Outcome: Progressing
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
## 2019-07-14 NOTE — Unmapped (Signed)
Michael [name] is alert and oriented x4, afebrile with stable VS. Pt with no c/o pain or nausea. RLE dressing changed this afternoon per wound orders. Pt with active bowel and good urine output, incontinent of both with condom cath in place. Fall precautions and pt safety maintained. Emotional and educational support provided. Family at bedside this afternoon. Awaiting SNF placement. Will continue to monitor.     Problem: Adult Inpatient Plan of Care  Goal: Plan of Care Review  Outcome: Progressing  Goal: Patient-Specific Goal (Individualization)  Outcome: Progressing  Goal: Absence of Hospital-Acquired Illness or Injury  Outcome: Progressing  Goal: Optimal Comfort and Wellbeing  Outcome: Progressing  Goal: Readiness for Transition of Care  Outcome: Progressing  Goal: Rounds/Family Conference  Outcome: Progressing     Problem: Fall Injury Risk  Goal: Absence of Fall and Fall-Related Injury  Outcome: Progressing     Problem: Skin Injury Risk Increased  Goal: Skin Health and Integrity  Outcome: Progressing     Problem: Wound  Goal: Optimal Wound Healing  Outcome: Progressing     Problem: Self-Care Deficit  Goal: Improved Ability to Complete Activities of Daily Living  Outcome: Progressing     Problem: Diabetes Comorbidity  Goal: Blood Glucose Level Within Desired Range  Outcome: Progressing     Problem: Infection  Goal: Infection Symptom Resolution  Outcome: Progressing

## 2019-07-15 LAB — CBC
HEMATOCRIT: 30.5 % — ABNORMAL LOW (ref 41.0–53.0)
HEMOGLOBIN: 10 g/dL — ABNORMAL LOW (ref 13.5–17.5)
MEAN CORPUSCULAR HEMOGLOBIN CONC: 32.6 g/dL (ref 31.0–37.0)
MEAN CORPUSCULAR HEMOGLOBIN: 32.4 pg (ref 26.0–34.0)
MEAN CORPUSCULAR VOLUME: 99.2 fL (ref 80.0–100.0)
PLATELET COUNT: 317 10*9/L (ref 150–440)
RED BLOOD CELL COUNT: 3.08 10*12/L — ABNORMAL LOW (ref 4.50–5.90)
RED CELL DISTRIBUTION WIDTH: 14.4 % (ref 12.0–15.0)
WBC ADJUSTED: 3.4 10*9/L — ABNORMAL LOW (ref 4.5–11.0)

## 2019-07-15 LAB — BASIC METABOLIC PANEL
ANION GAP: 3 mmol/L — ABNORMAL LOW (ref 7–15)
BUN / CREAT RATIO: 23
CALCIUM: 8.5 mg/dL (ref 8.5–10.2)
CHLORIDE: 108 mmol/L — ABNORMAL HIGH (ref 98–107)
CO2: 28 mmol/L (ref 22.0–30.0)
CREATININE: 0.61 mg/dL — ABNORMAL LOW (ref 0.70–1.30)
EGFR CKD-EPI AA MALE: >90 mL/min/{1.73_m2} (ref >=60)
EGFR CKD-EPI NON-AA MALE: >90 mL/min/{1.73_m2} (ref >=60)
GLUCOSE RANDOM: 93 mg/dL (ref 70–179)
SODIUM: 139 mmol/L (ref 135–145)

## 2019-07-15 LAB — EGFR CKD-EPI AA MALE: Glomerular filtration rate/1.73 sq M.predicted.black:ArVRat:Pt:Ser/Plas/Bld:Qn:Creatinine-based formula (CKD-EPI): >90

## 2019-07-15 LAB — HEMATOCRIT: Hematocrit:VFr:Pt:Bld:Qn:: 30.5 — ABNORMAL LOW

## 2019-07-15 MED ORDER — VANCOMYCIN 1.25 GRAM/250 ML IN 0.9 % SODIUM CHLORIDE INTRAVENOUS
Freq: Two times a day (BID) | INTRAVENOUS | 0 refills | 0.00000 days
Start: 2019-07-15 — End: 2019-08-20

## 2019-07-15 MED ORDER — CEFTRIAXONE IVPB 2 GRAM CONNECTOR BAG
INTRAVENOUS | 0 refills | 0 days | Status: CP
Start: 2019-07-15 — End: 2019-08-20

## 2019-07-15 MED ORDER — METRONIDAZOLE 500 MG TABLET
Freq: Three times a day (TID) | ORAL | 0 refills | 0 days
Start: 2019-07-15 — End: 2020-08-19

## 2019-07-15 MED ORDER — ENOXAPARIN 40 MG/0.4 ML SUBCUTANEOUS SYRINGE
SUBCUTANEOUS | 0 refills | 0.00000 days
Start: 2019-07-15 — End: ?

## 2019-07-15 MED ORDER — GABAPENTIN 300 MG CAPSULE
ORAL_CAPSULE | Freq: Every evening | ORAL | 0 refills | 30.00000 days
Start: 2019-07-15 — End: ?

## 2019-07-15 MED ADMIN — calcium carbonate (OS-CAL) tablet 600 mg of elem calcium: 600 mg | ORAL | @ 12:00:00 | Stop: 2019-07-15

## 2019-07-15 MED ADMIN — pravastatin (PRAVACHOL) tablet 40 mg: 40 mg | ORAL

## 2019-07-15 MED ADMIN — gabapentin (NEURONTIN) capsule 300 mg: 300 mg | ORAL

## 2019-07-15 MED ADMIN — furosemide (LASIX) tablet 20 mg: 20 mg | ORAL | @ 12:00:00 | Stop: 2019-07-15

## 2019-07-15 MED ADMIN — metroNIDAZOLE (FLAGYL) tablet 500 mg: 500 mg | ORAL | Stop: 2019-07-23

## 2019-07-15 MED ADMIN — vancomycin (VANCOCIN) 1250 mg in sodium chloride (NS) 0.9 % 250 mL IVPB (premix): 1250 mg | INTRAVENOUS | @ 03:00:00 | Stop: 2019-07-22

## 2019-07-15 MED ADMIN — cefTRIAXone (ROCEPHIN) 2 g in sodium chloride 0.9 % (NS) 100 mL IVPB-connector bag: 2 g | INTRAVENOUS | Stop: 2019-07-22

## 2019-07-15 MED ADMIN — enzalutamide (XTANDI) capsule 120 mg **Patient Home Med**: 120 mg | ORAL | @ 12:00:00 | Stop: 2019-07-15

## 2019-07-15 MED ADMIN — metroNIDAZOLE (FLAGYL) tablet 500 mg: 500 mg | ORAL | @ 18:00:00 | Stop: 2019-07-15

## 2019-07-15 MED ADMIN — calcium carbonate (OS-CAL) tablet 600 mg of elem calcium: 600 mg | ORAL

## 2019-07-15 MED ADMIN — metroNIDAZOLE (FLAGYL) tablet 500 mg: 500 mg | ORAL | @ 12:00:00 | Stop: 2019-07-15

## 2019-07-15 MED ADMIN — cholecalciferol (vitamin D3 25 mcg (1,000 units)) tablet 12.5 mcg: 12.5 ug | ORAL | @ 12:00:00 | Stop: 2019-07-15

## 2019-07-15 MED ADMIN — cholecalciferol (vitamin D3 25 mcg (1,000 units)) tablet 12.5 mcg: 12.5 ug | ORAL

## 2019-07-15 NOTE — Unmapped (Signed)
Comment: Pt received alert and oriented, vitals stable, remains afebrile. Administered IV abx per MAR. Pt denies pain. Pt received w/ right foot ulcer, dressing CDI. Pt received incontinent of stool and urine, external catheter replaced this shift. Pt with some redness due to incontinence. Falls precautions maintained, bed alarm on. Pt able to turn self independently in bed. Will continue to monitor.  Problem: Adult Inpatient Plan of Care  Goal: Plan of Care Review  Outcome: Ongoing - Unchanged  Goal: Patient-Specific Goal (Individualization)  Outcome: Ongoing - Unchanged  Goal: Absence of Hospital-Acquired Illness or Injury  Outcome: Ongoing - Unchanged  Goal: Optimal Comfort and Wellbeing  Outcome: Ongoing - Unchanged  Goal: Readiness for Transition of Care  Outcome: Ongoing - Unchanged  Goal: Rounds/Family Conference  Outcome: Ongoing - Unchanged     Problem: Fall Injury Risk  Goal: Absence of Fall and Fall-Related Injury  Outcome: Ongoing - Unchanged     Problem: Skin Injury Risk Increased  Goal: Skin Health and Integrity  Outcome: Ongoing - Unchanged     Problem: Wound  Goal: Optimal Wound Healing  Outcome: Ongoing - Unchanged     Problem: Self-Care Deficit  Goal: Improved Ability to Complete Activities of Daily Living  Outcome: Ongoing - Unchanged     Problem: Diabetes Comorbidity  Goal: Blood Glucose Level Within Desired Range  Outcome: Ongoing - Unchanged     Problem: Infection  Goal: Infection Symptom Resolution  Outcome: Ongoing - Unchanged
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
## 2019-07-15 NOTE — Unmapped (Signed)
Pt's VSS, afebrile and no falls or injuries. Pt is alert and oriented x 4 with no complaints of pain. COVID test performed this morning for discharge and was negative. Dressing on foot changed this morning. Report called to SNF. Transport just left with pt

## 2019-07-15 NOTE — Unmapped (Signed)
Vascular Surgery / Podiatry Progress Note    Assessment and Plan    Mr. Perkin is an 84 year old male with PMH of HTN, HLD, HFrEF, SSS pacemaker, Hypothyroidism, DVT, Metastatic prostate CA with bone mets. He was admitted to General medicine for bilateral leg weakness. This improved. He also had right heel ulceration that probed to bone. Xray negative for osteomyelitis. However, due to exposed bone, presume osteomyelitis.     Dr. Daisey Must performed a debridement, biopsy and Theraskin to the wound on 07/09/2019.  Did well post op.   Today, took off adaptic and reapplied after cleansing wound.      Xray - done   Dressing - see dc instructions below. Adaptic/ gauze / ABD / Kerlix / ACE.    Offloading - heel offloading on the right.    Weight bearing  - No heel WB. Forefoot only   Nutrition - HgA1c 5.8 07/05/19   Arterial status - Palpable pulses. No duplex done.    Antibiotics - ID following   Appt.  - Dr. Daisey Must 7/2    Flo Shanks, PA-C  Vascular Surgery  [phone] Please page with questions M-F 6am - 2pm    Interval History  Doing well. Discharging today.   Understands to keep weight off of the heel.     Exam  Right heel.   Dressing removed. Staples removed from adaptic.   Staples and stitches for dermal skin substitute.   No erythema. Minimal to no drainage. No necrosis. Nontender.   Still has depth. No bone palpable.               DISCHARGE INSTRUCTIONS  Right plantar heel.  Every other day dressing change.  PRN if dressing becomes wet, soiled or dislodged or if has breakthrough drainage.   Remove dressing. Has non adherent dressing on wound. Has stitches/staples under that.   Cleanse foot / periwound skin with Dakins. CLEANSE WOUND with SALINE.   Cut non adherent gauze (adaptic / cuticerin) to size of wound and place on wound.   Cover with Dry Gauze/ ABD.   Wrap with kerlix / ACE. Secure with tape.  Do not get wet. COVER with cast cover or other modalities if taking a shower.   No weight on heel.   Wear heel offloading shoe when mobilizing
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
## 2019-07-16 MED ORDER — FUROSEMIDE 20 MG TABLET
ORAL_TABLET | Freq: Every day | ORAL | 0 refills | 30.00000 days
Start: 2019-07-16 — End: ?

## 2019-07-17 ENCOUNTER — Ambulatory Visit
Admit: 2019-07-17 | Discharge: 2019-07-18 | Payer: MEDICARE | Attending: Foot & Ankle Surgery | Primary: Foot & Ankle Surgery

## 2019-07-17 DIAGNOSIS — L97411 Non-pressure chronic ulcer of right heel and midfoot limited to breakdown of skin: Principal | ICD-10-CM

## 2019-07-17 DIAGNOSIS — C61 Malignant neoplasm of prostate: Principal | ICD-10-CM

## 2019-07-17 DIAGNOSIS — L0390 Cellulitis, unspecified: Principal | ICD-10-CM

## 2019-07-17 DIAGNOSIS — C7951 Secondary malignant neoplasm of bone: Principal | ICD-10-CM

## 2019-07-17 NOTE — Unmapped (Addendum)
Home Health Instructions:    Please cleanse the wound and the rest of the foot/leg with dermal wound cleanser or soap and water prior to dressing application.  [facility] [place] [facility] [place]   Home Health frequency is Daily   Dressing change frequency is Daily   See dressing orders. May substitute for generic dressings when applicable.  Please call [facility] for needs if necessary [phone]      WOUND CARE INSTRUCTIONS:        Wash area with Dakins with one wash cloth, clean the dakins off with a clean cloth, and then pat dry with a dry cloth before the dressing change.    Dressing:     Apply a dry sterile dressing to the surgical site ( gauze, kerlix, ace wrap 0       If you have any questions or concerns regarding your wound or wound care, please contact us at the [facility]ound He[facility] and Podiatry clinic at [phone].
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
## 2019-07-27 NOTE — Unmapped (Signed)
[facility] Refill Coordination Note    Specialty Medication(s) to be Shipped:   Hematology/Oncology: Michael [name]    Other medication(s) to be shipped: n/a     Michael [name], DOB: [birth year]  Phone: [phone] (home)       All above HIPAA information was verified with patient's family member, Daughter.     Was a Nurse, learning disability used for this call? No    Completed refill call assessment today to schedule patient's medication shipment from the [facility] [phone]).       Specialty medication(s) and dose(s) confirmed: Regimen is correct and unchanged.   Changes to medications: Michael [name] reports no changes at this time.  Changes to insurance: No  Questions for the pharmacist: No    Confirmed patient received Welcome Packet with first shipment. The patient will receive a drug information handout for each medication shipped and additional FDA Medication Guides as required.       DISEASE/MEDICATION-SPECIFIC INFORMATION        N/A    SPECIALTY MEDICATION ADHERENCE     Medication Adherence    Patient reported X missed doses in the last month: 0  Specialty Medication: Xtandi 40mg   Patient is on additional specialty medications: No  Informant: child/children  Adherence tools used: medication list   Other adherence tool: routine   Support network for adherence: family member                Xtandi 40 mg:  4 days of medicine on hand         SHIPPING     Shipping address confirmed in Epic.     Delivery Scheduled: Yes, Expected medication delivery date: 07/29/19.     Medication will be delivered via Next Day Courier to the prescription address in [place].    Michael [name] Michael [name]   [facility] Specialty Technician

## 2019-07-28 MED FILL — XTANDI 40 MG CAPSULE: ORAL | 30 days supply | Qty: 90 | Fill #7

## 2019-07-28 MED FILL — XTANDI 40 MG CAPSULE: 30 days supply | Qty: 90 | Fill #7 | Status: AC

## 2019-07-29 NOTE — Unmapped (Signed)
[facility] Return Visit       Assessment and Plan:   1. Right heel ulcer with osteomyelitis and RLE cellulitis, s/p surgical debridement with bone biopsy (DOS 07/09/19). Upon exam today, wound is making great progress and looks healthy with overall decrease in depth. His staples were intact and removed without incident using aseptic technique. His right ulcer swab culture taken on 07/06/19 grew MRSA and strep agalactiae group b, and bone biopsy from 07/09/19 grew coag negative staph species, which is contradictory to the pathology evaluation of bone due to patient being on chemotherapy. Therefore, we will continue on IV Vancomycin, IV Ceftriaxone, and IV Flagyl (6/24-8/5). In the interim, patient should cleanse wound with dakins and apply wtd dressings every other day. Patient is able to wear a normal shoe to his LLE, but should continue to wear heel offloading shoe to RLE. We dicussed once he is discharged from SNF, he will need to be established with a home health agency for wound care.         Recommendations:  - clean wound with Dakins and dress with wtd  - continue abx  - continue to offload in heel offloading shoe  - f/u 2 weeks    Patient instructions on wound care were provided and understood in the End of Visit Summary. All questions were answered to patient's satisfaction today and patient to call the office if worsening of symptoms occurs or any questions arise.       HISTORY OF PRESENT ILLNESS: Michael [name] is a 84 y.o. 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 He is doing well today, denies any pain or discomfort. Reports his abx gave him diarrhea, but is experiencing no severe nausea or stomach pain. He does not yet have a date where he is expected to be discharged from SNF.       ROS:   Consitutional: Denies fever, chills or muscle aches   Respiratory: Denies cough, shortness of breath, wheezing  Gastrointestinal: +diahrreah Denies abdominal pain, nausea, vomiting   Musculoskeletal: Denies joint pain, swelling, or musculoskeletal pain  Skin: +ulcer Denies rashes, sores, blisters, growths   Neurological: no numbness or tingling       RX/ ALLERGIES/ MED HX/SURG HX/ SOC HX/FAM HX: Reviewed & updated in Epic      PHYSICAL EXAM:   Consitutional: No acute distress, alert and oriented.  Psychiatric: Alert and Oriented to Person, Place and Time. Pt is Cooperative, Pleasant, and Understanding    Cardiovascular: DP and PT pulses were palpable b/l 2/4, right lower extremity edema noted . Minor telangiectasis noted b/l ankle.   ??  Dermatological: No acute redness, swelling or concerning signs of infection. No acute drainage at this time.  Wound is overall improving; Surgical site has increase in granular tissue, decrease in wound depth. Wound post debridement measuring 3.5x1.8x0.9cm.   ??  Neurological: Gross epicritic sensation is intact but reduced.  This was tested with sharp dull sensation patient is unable to distinguish   ??  Musculoskeletal exam shows 4-5 minimal strength of major muscular's function on the foot and ankle bilateral but decreased range of motion ankle and pedal joints stiffness is appreciated.      Post debridement      Wound 07/05/19 Other (comment) Heel Right Right heel surgical wound (Active)   Wound Image   07/17/19 1418   Wound Status Not Healed 07/31/19 0816  Pain 0 07/31/19 0816   Dressing Status      Removed 07/31/19 0816   Wound Length (cm) 1.4 cm 07/31/19 0816   Wound Width (cm) 2.2 cm 07/31/19 0816   Wound Depth (cm) 0.5 cm 07/31/19 0816   Wound Surface Area (cm^2) 3.08 cm^2 07/31/19 0816   Wound Volume (cm^3) 1.54 cm^3 07/31/19 0816   Wound Healing % 75 07/31/19 0816   Wound Bed Yellow;Red 07/31/19 0816   Odor None 07/31/19 0816   Margins Defined edges 07/31/19 0816   Peri-wound Assessment      Edema 07/31/19 0816   Encounter Subsequent 07/31/19 0816   Slough % 51-75% 07/31/19 0816   Granulation % 1-25% 07/31/19 0816   Exudate Type      Sero-sanguineous 07/31/19 0816   Exudate Amnt      Moderate 07/31/19 0816   Thickness Full Thickness 07/31/19 0816   Texture Normal For Patient 07/31/19 0816   Moisture Normal For Patient 07/31/19 0816   Temperature WNL 07/31/19 0816   Hypergranuation No 07/31/19 0816   Tunneling      No 07/31/19 0816   Undermining     No 07/31/19 0816   Sinus Tract No 07/31/19 0816   Treatments Cleansed/Irrigation;Pharmaceutical agent 07/31/19 0816   Picture Taken Yes 07/31/19 0816   Dressing Dry gauze;Abdominal dressing (ABD);Gauze wrap - 4.5 Kerlix;Other (Comment) 07/14/19 1500   Length (Pre Debridement) 1.4 cm 07/31/19 0816   Width (Pre Debridement) 2.2 cm 07/31/19 0816   Depth (Pre Debridement) 0.5 cm 07/31/19 0816   Pre-Procedure Pain 0 07/31/19 0816   Time Out Taken Yes 07/31/19 0816   Instrument Used scalpel;curette 07/31/19 0816   Tissue/Material Removed non-viable;slough 07/31/19 0816   Bleeding minimal 07/31/19 0816   Bleeding controlled with pressure 07/31/19 0816   Specimen Taken none 07/31/19 0816   Type of Debridement surgical 07/31/19 0816   Level of Debridement skin, epidermis 07/31/19 0816   Procedural Pain 0 07/31/19 0816   Length (Post-Debridement) 1.8 cm 07/31/19 0816   Width (Post Debridement) 3.5 cm 07/31/19 0816   Depth (Post-Debridement) 0.9 cm 07/31/19 0816   Post Procedural Pain 0 07/31/19 0816   Area(Pre-Debridement) 3.08 sq cm 07/31/19 0816   Area(Post-Debridement) 6.3 sq cm 07/31/19 0816   Volume(Pre-Debridement) 1.54 sq cm 07/31/19 0816   Volume(Post-Debridement) 5.67 sq cm 07/31/19 0816               PROCEDURE:  1. Staples removed using aseptic technique. Patient tolerated this well.    2.  Procedure: right foot, plantar heel   At this time patient's wound was debrided due to the increase in Devitalized tissue of epidermis/dermis, exudate, callus tissue, and other necrotic tissue impairing proper wound healing as reported in the Objective. All benefits, risks, and alternatives were explained in detail with the patient. A time out was taken prior to the procedure. The area was prepped in standard clinic fashion. The wound was anesthetized withlidocaine topical 4% for 15 minute prior to debridement.. An excisional Sharp, surgical debridement was performed to fully debride and remove all central devitalized and necrotic tissue including subcutaneous tissue. The debridement occurred to the level of subcutaneous tissue until  bleeding granulation tissue was observed in 100% of the wound bed.  This was achieved with the use of a curette, forceps and scalpel. Blood loss was minimal.  Hemostasis was achieved with pressure and the post-debridement measurements are documented in the chart. Patient tolerated procedure well at this time.  LABS/IMAGING:   I have personally reviewed pertinent diagnostic studies, including:     A1c on 07/05/19 was 5.8%     Bone biopsy on 07/09/19 grew coagulase negative staph species    Right ulcer swab taken on 07/06/19 grew MRSA and strep agalactiae group b      Scribe's Attestation: Michael [name], DPM obtained and performed the history, physical exam and medical decision making elements that were  entered into the chart. Documentation assistance was provided by me personally, a scribe. Signed by Dolphus Jenny, Scribe, on July 31, 2019 at 8:19 AM.     ----------------------------------------------------------------------------------------------------------------------  August 03, 2019 1:51 PM. Documentation assistance provided by the Scribe. I was present during the time the encounter was recorded. The information recorded by the Scribe was done at my direction and has been reviewed and validated by me.  ----------------------------------------------------------------------------------------------------------------------

## 2019-07-30 NOTE — Unmapped (Signed)
Abstraction Result Flowsheet Data    This patient's last AWV date: [facility] Last Medicare Wellness Visit Date: 03/29/2017  This patients last WCC/CPE date: : Not Found      Reason for Encounter  Reason for Encounter: Outreach  Primary Reason for Call: AWV  Outreach Call Outcome: Left message  Text Message: No

## 2019-07-31 ENCOUNTER — Encounter
Admit: 2019-07-31 | Discharge: 2019-08-01 | Payer: MEDICARE | Attending: Foot & Ankle Surgery | Primary: Foot & Ankle Surgery

## 2019-07-31 DIAGNOSIS — L97412 Non-pressure chronic ulcer of right heel and midfoot with fat layer exposed: Principal | ICD-10-CM

## 2019-07-31 DIAGNOSIS — L0390 Cellulitis, unspecified: Principal | ICD-10-CM

## 2019-07-31 NOTE — Unmapped (Addendum)
Home Health Instructions:    Please cleanse the wound and the rest of the foot/leg with dermal wound cleanser or soap and water prior to dressing application.  [facility] Health agency [place] [facility] [place]   Home Health frequency is Daily   Dressing change frequency is every other day   See dressing orders. May substitute for generic dressings when applicable.  Please call [facility] for needs if necessary [phone]      WOUND CARE INSTRUCTIONS:        Wash area with Dakins with one wash cloth, clean the dakins off with a clean cloth, and then pat dry with a dry cloth before the dressing change.    Dressing:     Apply dakins wet to dry gauze to the surgical site secure with kerlix and ace wrap.       If you have any questions or concerns regarding your wound or wound care, please contact us at the [facility] and Pod[facility] clinic at [phone].
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
## 2019-08-10 ENCOUNTER — Ambulatory Visit: Admit: 2019-08-10 | Payer: MEDICARE

## 2019-08-16 NOTE — Unmapped (Signed)
[facility] Return Visit       Assessment and Plan:   1. Right heel ulcer with osteomyelitis and RLE cellulitis, s/p surgical debridement with bone biopsy (DOS 07/09/19). Upon exam today, wound is making great progress and looks healthy with overall decrease in depth. Will order an XR for further evaluation of any remaining infection. His right ulcer swab culture taken on 07/06/19 grew MRSA and strep agalactiae group b, and bone biopsy from 07/09/19 grew coag negative staph species, which is contradictory to the pathology evaluation of bone due to patient being on chemotherapy. He will finish his IV Vancomycin, IV Ceftriaxone, and PO Flagyl tomorrow, 08/20/19. Currently there is no active infection and his ulcer is healing very well. ID will be the point of contact for  removing PICC line. Next appointment on August 23 with Dr. Michiel Cowboy.      In the interim, patient should cleanse wound with dakins and apply collagen dressing. Patient is able to wear a normal shoe to his LLE, but should continue to wear heel offloading shoe to RLE; he can wear a normal shoe to RLE when driving only. He should use a walker and wheelchair when going long distances. We dicussed once he is discharged from SNF, he will need to be established with a home health agency for wound care. Will coordinate with nurses regarding this.     Of note, I encouraged Michael [name] to receive the COVID-19 vaccine. He has no restrictions from a podiatry standpoint, however I instructed him to follow up with his oncologist to make sure there are no restrictions on their end.         Recommendations:  - clean wound with Dakins and dress with collagen  - continue abx  - have ID consider removing PICC line  - continue to offload in heel offloading shoe. Able to transition to normal shoe when driving.   - ambulate using walker or wheelchair when going long distances  - ambulate in offloading shoe to RLE, can wear normal shoe when driving  - f/u 3 weeks  - establish with home health, will coordinate with nurses  - obtain XR    Patient instructions on wound care were provided and understood in the End of Visit Summary. All questions were answered to patient's satisfaction today and patient to call the office if worsening of symptoms occurs or any questions arise.       HISTORY OF PRESENT ILLNESS: Michael [name] is a 84 y.o. 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 He remains on IV vanco and ceftriaxone, and PO flagyl. He is scheduled to finish his abx tomorrow, 08/20/19, and is scheduled to leave SNF on Friday. He has obtained his diabetic shoes, however expressed concern for being about to ambulate on his own at home without falling. Otherwise, has no new acute complaints in reference to his wound.      ROS:   Consitutional: Denies fever, chills or muscle aches   Respiratory: Denies cough, shortness of breath, wheezing  Gastrointestinal: Denies abdominal pain, nausea, vomiting   Musculoskeletal: Denies joint pain, swelling, or musculoskeletal pain  Skin: +ulcer Denies rashes, sores, blisters, growths   Neurological: no numbness or tingling       RX/ ALLERGIES/ MED HX/SURG HX/ SOC HX/FAM HX: Reviewed & updated in Epic      PHYSICAL EXAM:   Consitutional: No acute distress, alert  and oriented.  Psychiatric: Alert and Oriented to Person, Place and Time. Pt is Cooperative, Pleasant, and Understanding    Cardiovascular: DP and PT pulses were palpable b/l 2/4, right lower extremity edema noted . Minor telangiectasis noted b/l ankle.   ??  Dermatological: No acute redness, swelling or concerning signs of infection. No acute drainage at this time.  Wound is overall improving and healthier; Surgical site has increase in granular tissue, decrease in wound depth.  ??  Neurological: Gross epicritic sensation is intact but reduced.  This was tested with sharp dull sensation patient is unable to distinguish   ??  Musculoskeletal exam shows 4-5 minimal strength of major muscular's function on the foot and ankle bilateral but decreased range of motion ankle and pedal joints stiffness is appreciated.      Wound 07/05/19 Other (comment) Heel Right Right heel surgical wound (Active)   Wound Image   08/19/19 0903   Wound Status Not Healed 08/19/19 0903   Pain 0 08/19/19 0903   Dressing Status      Removed 08/19/19 0903   Wound Length (cm) 1.1 cm 08/19/19 0903   Wound Width (cm) 1.7 cm 08/19/19 0903   Wound Depth (cm) 0.4 cm 08/19/19 0903   Wound Surface Area (cm^2) 1.87 cm^2 08/19/19 0903   Wound Volume (cm^3) 0.75 cm^3 08/19/19 0903   Wound Healing % 88 08/19/19 0903   Wound Bed Yellow;Red 08/19/19 0903   Odor None 08/19/19 0903   Margins Defined edges 08/19/19 0903   Peri-wound Assessment      Edema 08/19/19 0903   Encounter Subsequent 08/19/19 0903   Slough % 26-50% 08/19/19 0903   Granulation % 26-50% 08/19/19 0903   Exudate Type      Sero-sanguineous 08/19/19 0903   Exudate Amnt      Moderate 08/19/19 0903   Thickness Full Thickness 08/19/19 0903   Texture Normal For Patient 08/19/19 0903   Moisture Normal For Patient 08/19/19 0903   Temperature WNL 08/19/19 0903   Hypergranuation No 08/19/19 0903   Tunneling      No 08/19/19 0903   Undermining     No 08/19/19 0903   Sinus Tract No 08/19/19 0903   Treatments Cleansed/Irrigation;Pharmaceutical agent 08/19/19 0903   Picture Taken Yes 08/19/19 0903   Length (Pre Debridement) 1.1 cm 08/19/19 0903   Width (Pre Debridement) 1.7 cm 08/19/19 0903   Depth (Pre Debridement) 0.4 cm 08/19/19 0903   Pre-Procedure Pain 0 08/19/19 0903   Time Out Taken Yes 08/19/19 0903   Instrument Used scalpel;curette 08/19/19 0903   Tissue/Material Removed non-viable;slough 08/19/19 0903   Bleeding minimal 08/19/19 0903   Bleeding controlled with pressure 08/19/19 0903   Specimen Taken none 08/19/19 0903   Type of Debridement surgical 08/19/19 0903   Level of Debridement skin, epidermis 08/19/19 0903   Procedural Pain 0 08/19/19 0903   Length (Post-Debridement) 1.1 cm 08/19/19 0903   Width (Post Debridement) 1.7 cm 08/19/19 0903   Depth (Post-Debridement) 0.4 cm 08/19/19 0903   Post Procedural Pain 0 08/19/19 0903   Area(Pre-Debridement) 1.87 sq cm 08/19/19 0903   Area(Post-Debridement) 1.87 sq cm 08/19/19 0903   Volume(Pre-Debridement) 0.75 sq cm 08/19/19 0903   Volume(Post-Debridement) 0.75 sq cm 08/19/19 0903         PROCEDURE:  1.  Procedure: right foot, plantar heel   At this time patient's wound was debrided due to the increase in Devitalized tissue of epidermis/dermis, exudate, callus tissue, and other necrotic tissue  impairing proper wound healing as reported in the Objective. All benefits, risks, and alternatives were explained in detail with the patient. A time out was taken prior to the procedure. The area was prepped in standard clinic fashion. The wound was anesthetized withlidocaine topical 4% for 15 minute prior to debridement.. An excisional Sharp, surgical debridement was performed to fully debride and remove all central devitalized and necrotic tissue including subcutaneous tissue. The debridement occurred to the level of subcutaneous tissue until  bleeding granulation tissue was observed in 100% of the wound bed.  This was achieved with the use of a curette, forceps and scalpel. Blood loss was minimal.  Hemostasis was achieved with pressure and the post-debridement measurements are documented in the chart. Patient tolerated procedure well at this time.       LABS/IMAGING:   I have personally reviewed pertinent diagnostic studies, including:     A1c on 07/05/19 was 5.8%     Bone biopsy on 07/09/19 grew coagulase negative staph species    Right ulcer swab taken on 07/06/19 grew MRSA and strep agalactiae group b      Scribe's Attestation: Stephens November, DPM obtained and performed the history, physical exam and medical decision making elements that were  entered into the chart. Documentation assistance was provided by me personally, a scribe. Signed by Dolphus Jenny, Scribe, on August 19, 2019 at 8:36 AM.     ----------------------------------------------------------------------------------------------------------------------  August 20, 2019 2:07 PM. Documentation assistance provided by the Scribe. I was present during the time the encounter was recorded. The information recorded by the Scribe was done at my direction and has been reviewed and validated by me.  ----------------------------------------------------------------------------------------------------------------------

## 2019-08-17 NOTE — Unmapped (Signed)
[place] [facility] Refill Coordination Note    Specialty Medication(s) to be Shipped:   Hematology/Oncology: [name]    Other medication(s) to be shipped: No additional medications requested for fill at this time     Michael [name], DOB: 1935-07-26  Phone: [phone] (home)       All above HIPAA information was verified with patient's family member, Daughter Andrey Campanile.     Was a Nurse, learning disability used for this call? No    Completed refill call assessment today to schedule patient's medication shipment from the [facility] [phone]).       Specialty medication(s) and dose(s) confirmed: Regimen is correct and unchanged.   Changes to medications: Azarion reports no changes at this time.  Changes to insurance: No  Questions for the pharmacist: No    Confirmed patient received Welcome Packet with first shipment. The patient will receive a drug information handout for each medication shipped and additional FDA Medication Guides as required.       DISEASE/MEDICATION-SPECIFIC INFORMATION        N/A    SPECIALTY MEDICATION ADHERENCE     Medication Adherence    Patient reported X missed doses in the last month: 0  Specialty Medication: Xtandi 40mg   Patient is on additional specialty medications: No  Informant: child/children  Adherence tools used: medication list   Other adherence tool: routine   Support network for adherence: family member                Xtandi 40 mg: 14 days of medicine on hand         SHIPPING     Shipping address confirmed in Epic.     Delivery Scheduled: Yes, Expected medication delivery date: 08/28/19.     Medication will be delivered via Same Day Courier to the prescription address in Epic [place].    Michael [name] M Michael [name]   [facility] Specialty Technician

## 2019-08-17 NOTE — Unmapped (Signed)
Abstraction Result Flowsheet Data    This patient's last AWV date: [facility] Last Medicare Wellness Visit Date: 03/29/2017  This patients last WCC/CPE date: : Not Found      Reason for Encounter  Reason for Encounter: Outreach  Primary Reason for Call: AWV  Outreach Call Outcome: Left message  Text Message: No

## 2019-08-19 ENCOUNTER — Ambulatory Visit
Admit: 2019-08-19 | Discharge: 2019-08-20 | Payer: MEDICARE | Attending: Foot & Ankle Surgery | Primary: Foot & Ankle Surgery

## 2019-08-19 DIAGNOSIS — L97412 Non-pressure chronic ulcer of right heel and midfoot with fat layer exposed: Principal | ICD-10-CM

## 2019-08-19 DIAGNOSIS — L0390 Cellulitis, unspecified: Principal | ICD-10-CM

## 2019-08-19 NOTE — Unmapped (Addendum)
Home Health Instructions:    Please cleanse the wound and the rest of the foot/leg with dermal wound cleanser or soap and water prior to dressing application.  Home Health agency is [facility] [place]   Home Health frequency is Daily   Dressing change frequency is every other day   See dressing orders. May substitute for generic dressings when applicable.  Please call [facility] for needs if necessary [phone]      WOUND CARE INSTRUCTIONS:        Wash area with Dakins with one wash cloth, clean the dakins off with a clean cloth, and then pat dry with a dry cloth before the dressing change.    Dressing:     Apply collagen to the surgical site secure with kerlix and ace wrap.       If you have any questions or concerns regarding your wound or wound care, please contact us at the [facility] Podiatry clinic at [phone].

## 2019-08-24 MED ORDER — DAKIN'S SOLUTION 0.25 %
0 days
Start: 2019-08-24 — End: ?

## 2019-08-24 NOTE — Unmapped (Signed)
Patient would like a referral to Home Health. Offered appointment, but pt states he has a hard time getting in and out.     Routed to provider

## 2019-08-26 DIAGNOSIS — L97414 Non-pressure chronic ulcer of right heel and midfoot with necrosis of bone: Principal | ICD-10-CM

## 2019-08-26 DIAGNOSIS — L97412 Non-pressure chronic ulcer of right heel and midfoot with fat layer exposed: Principal | ICD-10-CM

## 2019-08-26 DIAGNOSIS — L97419 Non-pressure chronic ulcer of right heel and midfoot with unspecified severity: Principal | ICD-10-CM

## 2019-08-26 DIAGNOSIS — L97411 Non-pressure chronic ulcer of right heel and midfoot limited to breakdown of skin: Principal | ICD-10-CM

## 2019-08-26 NOTE — Unmapped (Signed)
Addended by: Noralyn Pick on: 08/26/2019 12:18 PM     Modules accepted: Orders

## 2019-08-26 NOTE — Unmapped (Signed)
[facility] Home Health order submitted. Home Health will have to contact specialist (podiatry/wound care) for specific wound care orders.   The specialty note states that home health was going to be coordinated through their nurses/clinic. Not sure what happened.

## 2019-08-26 NOTE — Unmapped (Signed)
Patient stated needs referral to home health. He has a wound that needs the dressing changed. I am not sure if that would be wound care or home health? But his insurance is who called stating he needs someone to come out. Has not been changed since Friday.

## 2019-08-27 NOTE — Unmapped (Signed)
Thank you for your Home Health referral.  Currently [facility] and [facility] are over capacity and unable to accommodate the ordered Home Health services for your patient  We apologize for this temporary status and any inconvenience it may cause.      K[place] Foil- Sherilyn Cooter, LPN   Central Intake Nurse for [facility] of [place], [place] and [place]

## 2019-08-28 ENCOUNTER — Encounter: Admit: 2019-08-28 | Discharge: 2019-08-29 | Payer: MEDICARE

## 2019-08-28 DIAGNOSIS — R600 Localized edema: Principal | ICD-10-CM

## 2019-08-28 DIAGNOSIS — L03115 Cellulitis of right lower limb: Principal | ICD-10-CM

## 2019-08-28 DIAGNOSIS — M86171 Other acute osteomyelitis, right ankle and foot: Principal | ICD-10-CM

## 2019-08-28 MED FILL — XTANDI 40 MG CAPSULE: 30 days supply | Qty: 90 | Fill #8 | Status: AC

## 2019-08-28 MED FILL — XTANDI 40 MG CAPSULE: ORAL | 30 days supply | Qty: 90 | Fill #8

## 2019-08-28 NOTE — Unmapped (Signed)
Lauralyn Primes [facility] she stated they do not have availability for staffing, please refer Nicholai else where for home health services Thanks

## 2019-08-28 NOTE — Unmapped (Signed)
Subjective:     HPI: Michael [name] is a 84 y.o. male here for   Chief Complaint   Patient presents with   ??? Hospitalization Follow-up     hospital and rehab followup   released from rehab on 8/06.     Walking in a boot given by [employer].  Patient was in the hospital for heel osteomyelitis and cellulitis.  He then went to rehab and is here following up for those 2 hospitalizations. He I suppose to have visiting  Nurses daily.He reports that  no has not come to his house  yet.  Reports that his appetite is very good.  He is able to do his cooking and other activities around the house.  He does have a list of follow-up appointments that he has to keep.  He still has his PICC line in.  It appears that will be taken out another few weeks.  He also has the availability of the wound clinic if he is having any problems.    I have reviewed past medical, surgical, medications, allergies, social and family histories today.    Objective:       General: Well developed. Well nourished. In no acute distress.  HEENT:  Normocephalic.  Atraumatic. PERRLA, EOMI     Neck:  No thyroid enlargement, carotid bruits, full range of motion  Heart:  Regular rate and rhythm . No murmurs or gallops  Lungs:  No respiratory distress.  Lungs clear to auscultation bilaterally.  Abdomen: Soft, bowel sounds normal, no tenderness, no organomegaly, no distension  Extremities: 1+edema B/L right foot pressure dressing around foot. peripheral pulses normal  Musculoskeletal: Full ROM of lumbar spine, gait normal, negative SLR  Skin:  Warm, dry, no rashes  Neuro:  Non-focal. CN II-XII intact.   Psych:  Affect normal, answers questions appropriately, eye contact good, speech clear and coherent.  [facility].     Review of systems negative unless otherwise noted as per HPI.      Assessment and Plan:     Michael [name] was seen today for hospitalization follow-up.    Diagnoses and all orders for this visit:    Osteomyelitis of foot, right, acute (CMS-HCC)    Cellulitis of right foot    Bilateral lower extremity edema       I believe patient is finished with his IV antibiotics.  His PICC line is still intact.  I have asked patient to restart his Lasix 20 mg once a day.  He may have to wear a pad of sorts to prevent incontinence  He is to follow-up with PCP in about 2 months        [facility]:     Medication adherence and barriers to the treatment plan have been addressed. Opportunities to optimize healthy behaviors have been discussed. Patient / caregiver voiced understanding.      Note - This record has been created using [employer]. Chart creation errors have been sought, but may not always have been located. Such creation errors do not reflect on the standard of medical care.

## 2019-08-28 NOTE — Unmapped (Signed)
Alternative referral and plan reviewed.

## 2019-08-29 NOTE — Unmapped (Signed)
[facility] called and stated they can not provide services at this time due to staffing

## 2019-08-31 NOTE — Unmapped (Signed)
Abstraction Result Flowsheet Data    This patient's last AWV date: [facility] Last Medicare Wellness Visit Date: 03/29/2017  This patients last WCC/CPE date: : Not Found      Reason for Encounter  Reason for Encounter: Outreach  Primary Reason for Call: AWV  Outreach Call Outcome: No voicemail available  Text Message: No

## 2019-09-01 NOTE — Unmapped (Signed)
Received a call from [facility] primary care regarding Mr. Kopko and his HH situation.  I was unable to reach him.

## 2019-09-01 NOTE — Unmapped (Signed)
Noted change in location for [facility] referral.

## 2019-09-07 ENCOUNTER — Encounter: Admit: 2019-09-07 | Discharge: 2019-09-08 | Payer: MEDICARE

## 2019-09-07 DIAGNOSIS — M869 Osteomyelitis, unspecified: Principal | ICD-10-CM

## 2019-09-07 LAB — CBC W/ AUTO DIFF
BASOPHILS ABSOLUTE COUNT: 0 10*9/L (ref 0.0–0.1)
BASOPHILS RELATIVE PERCENT: 1.2 %
EOSINOPHILS ABSOLUTE COUNT: 0.1 10*9/L (ref 0.0–0.7)
EOSINOPHILS RELATIVE PERCENT: 2.8 %
HEMATOCRIT: 33 % — ABNORMAL LOW (ref 38.0–50.0)
HEMOGLOBIN: 11.2 g/dL — ABNORMAL LOW (ref 13.5–17.5)
LYMPHOCYTES ABSOLUTE COUNT: 1 10*9/L (ref 0.7–4.0)
LYMPHOCYTES RELATIVE PERCENT: 27.5 %
MEAN CORPUSCULAR HEMOGLOBIN CONC: 34 g/dL (ref 30.0–36.0)
MEAN CORPUSCULAR HEMOGLOBIN: 32.9 pg (ref 26.0–34.0)
MEAN CORPUSCULAR VOLUME: 96.7 fL — ABNORMAL HIGH (ref 81.0–95.0)
MEAN PLATELET VOLUME: 7.3 fL (ref 7.0–10.0)
MONOCYTES ABSOLUTE COUNT: 0.3 10*9/L (ref 0.1–1.0)
MONOCYTES RELATIVE PERCENT: 7.8 %
NEUTROPHILS ABSOLUTE COUNT: 2.2 10*9/L (ref 1.7–7.7)
NEUTROPHILS RELATIVE PERCENT: 60.7 %
NUCLEATED RED BLOOD CELLS: 0 /100{WBCs} (ref <=4)
PLATELET COUNT: 258 10*9/L (ref 150–450)
RED BLOOD CELL COUNT: 3.41 10*12/L — ABNORMAL LOW (ref 4.32–5.72)
RED CELL DISTRIBUTION WIDTH: 16 % — ABNORMAL HIGH (ref 12.0–15.0)
WBC ADJUSTED: 3.6 10*9/L (ref 3.5–10.5)

## 2019-09-07 LAB — COMPREHENSIVE METABOLIC PANEL
ALBUMIN: 3.2 g/dL — ABNORMAL LOW (ref 3.4–5.0)
ALKALINE PHOSPHATASE: 52 U/L (ref 46–116)
ALT (SGPT): 9 U/L — ABNORMAL LOW (ref 10–49)
ANION GAP: 7 mmol/L (ref 5–14)
AST (SGOT): 15 U/L (ref <=34)
BILIRUBIN TOTAL: 0.2 mg/dL — ABNORMAL LOW (ref 0.3–1.2)
BLOOD UREA NITROGEN: 16 mg/dL (ref 9–23)
BUN / CREAT RATIO: 25
CALCIUM: 9.3 mg/dL (ref 8.7–10.4)
CHLORIDE: 106 mmol/L (ref 98–107)
CO2: 29.1 mmol/L (ref 20.0–31.0)
CREATININE: 0.63 mg/dL
EGFR CKD-EPI AA MALE: >90 mL/min/{1.73_m2} (ref >=60)
EGFR CKD-EPI NON-AA MALE: >90 mL/min/{1.73_m2} (ref >=60)
GLUCOSE RANDOM: 96 mg/dL (ref 70–179)
POTASSIUM: 3.6 mmol/L (ref 3.4–4.5)
PROTEIN TOTAL: 7.1 g/dL (ref 5.7–8.2)
SODIUM: 142 mmol/L (ref 135–145)
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
## 2019-09-07 NOTE — Unmapped (Addendum)
Thank you for coming in today.  I will call you if your lab results are abnormal.   Make sure to get your 2nd covid vaccine!    Please note that your laboratory and other results may be visible to you in real time, possibly before they reach your provider. Please allow 48 hours for clinical interpretation of these results. Importantly, even if a result is flagged as abnormal, it may not be one that impacts your health.    ?? The ID clinic phone number is [phone] (toll free [phone]).  ?? The ID clinic fax number is [phone].    For urgent issues on nights and weekends you may reach the ID Physician on call through the [facility] Operator at [phone].     Nile Dear, MD  [facility] Infectious Diseases Clinic at [facility]  [address]   [place], [place] [address]  Phone: [phone]   Fax: [phone]

## 2019-09-13 DIAGNOSIS — I10 Essential (primary) hypertension: Principal | ICD-10-CM

## 2019-09-13 MED ORDER — LOVASTATIN 40 MG TABLET
ORAL_TABLET | 0 refills | 0 days
Start: 2019-09-13 — End: ?

## 2019-09-13 MED ORDER — CHLORTHALIDONE 25 MG TABLET
ORAL_TABLET | 0 refills | 0 days
Start: 2019-09-13 — End: ?

## 2019-09-14 MED ORDER — LOVASTATIN 40 MG TABLET
ORAL_TABLET | Freq: Every day | ORAL | 0 refills | 90 days
Start: 2019-09-14 — End: 2020-09-13

## 2019-09-14 NOTE — Unmapped (Signed)
P/C requesting refill on Chorthalidone last ordered 06/2019 , and lovastatin last ordered on 04/20/19, last visit 03/13/19. Meds t'ed up

## 2019-09-15 MED ORDER — CHLORTHALIDONE 25 MG TABLET
ORAL_TABLET | Freq: Every morning | ORAL | 0 refills | 90 days
Start: 2019-09-15 — End: 2020-09-14

## 2019-09-15 NOTE — Unmapped (Signed)
P/C requesting refill on Chlorthalidone 25 mg , last ordered on 06/3019 last visit 03/13/19

## 2019-09-16 ENCOUNTER — Ambulatory Visit
Admit: 2019-09-16 | Discharge: 2019-09-17 | Payer: MEDICARE | Attending: Foot & Ankle Surgery | Primary: Foot & Ankle Surgery

## 2019-09-16 DIAGNOSIS — L97412 Non-pressure chronic ulcer of right heel and midfoot with fat layer exposed: Principal | ICD-10-CM

## 2019-09-16 DIAGNOSIS — L0390 Cellulitis, unspecified: Principal | ICD-10-CM

## 2019-09-16 MED ORDER — DOXYCYCLINE HYCLATE 100 MG TABLET
ORAL_TABLET | Freq: Two times a day (BID) | ORAL | 0 refills | 10 days | Status: CP
Start: 2019-09-16 — End: 2019-09-26

## 2019-09-16 NOTE — Unmapped (Signed)
duplicate

## 2019-09-16 NOTE — Unmapped (Signed)
[name]       Assessment and Plan:   1. Right heel ulcer with osteomyelitis and RLE cellulitis, s/p surgical debridement with bone biopsy (DOS 07/09/19). Upon exam today, wound is making great progress however is warm on examination. Wound was debrided and culture obtained. Doxycycline prescribed out of abundance of precaution as patient is immunocompromised. I will also discuss with ID regarding antibiotic recommendations based on culture results.       In the interim, patient should cleanse wound with dermal cleanse and apply collagen dressing. Prism order provided. Patient is able to wear a normal shoe to his LLE, but should continue to wear heel offloading shoe to RLE; he can wear a normal shoe to RLE when driving only. He should use a walker and wheelchair when going long distances.     Of note, Mr. Riemenschneider has received his 1st dose of the covid vaccine, and will receive his second dose on September 4th.    Recommendations:  - clean wound with dermal cleanse and dress with collagen  - abx prescribed  - continue to offload in heel offloading shoe. Able to transition to normal shoe when driving.   - ambulate using walker or wheelchair when going long distances  - ambulate in offloading shoe to RLE, can wear normal shoe when driving  - f/u 1 week patient was note of any conditions change      Patient instructions on wound care were provided and understood in the End of Visit Summary. All questions were answered to patient's satisfaction today and patient to call the office if worsening of symptoms occurs or any questions arise.       HISTORY OF PRESENT ILLNESS: EMRAH [name] is a 84 y.o. 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 He is doing well today. Reports his wound is healing well. Patient followed with ID on 09/07/19 where his abx were discontinued and PICC line removed. ROS:   Consitutional: Denies fever, chills or muscle aches   Respiratory: Denies cough, shortness of breath, wheezing  Gastrointestinal: Denies abdominal pain, nausea, vomiting   Musculoskeletal: Denies joint pain, swelling, or musculoskeletal pain  Skin: +ulcer Denies rashes, sores, blisters, growths   Neurological: no numbness or tingling       RX/ ALLERGIES/ MED HX/SURG HX/ SOC HX/FAM HX: Reviewed & updated in Epic      PHYSICAL EXAM:   Consitutional: No acute distress, alert and oriented.  Psychiatric: Alert and Oriented to Person, Place and Time. Pt is Cooperative, Pleasant, and Understanding    Cardiovascular: DP and PT pulses were palpable b/l 2/4, right lower extremity edema noted . Minor telangiectasis noted b/l ankle.   ??  Dermatological: Wound increase in granular tissue, some fibrotic tissue, surrounding peri wound erythema at sole of the foot with no purulence, no pain but increase warmth. Measures  2x0.5x0.7cm  ??  Neurological: Gross epicritic sensation is intact but reduced.  This was tested with sharp dull sensation patient is unable to distinguish   ??  Musculoskeletal exam shows 4-5 minimal strength of major muscular's function on the foot and ankle bilateral but decreased range of motion ankle and pedal joints stiffness is appreciated.    Wound 07/05/19 Other (comment) Heel Right Right heel surgical wound (Active)   Wound Image   09/16/19 1311   Wound Status Not Healed 09/16/19 1311   Pain 0 09/16/19  1311   Dressing Status      Removed 09/16/19 1311   Wound Length (cm) 0.5 cm 09/16/19 1311   Wound Width (cm) 2.2 cm 09/16/19 1311   Wound Depth (cm) 0.5 cm 09/16/19 1311   Wound Surface Area (cm^2) 1.1 cm^2 09/16/19 1311   Wound Volume (cm^3) 0.55 cm^3 09/16/19 1311   Wound Healing % 91 09/16/19 1311   Wound Bed Yellow;Red 09/16/19 1311   Odor None 09/16/19 1311   Margins Defined edges 09/16/19 1311   Peri-wound Assessment      Edema 09/16/19 1311   Encounter Subsequent 09/16/19 1311   Slough % 1-25% 09/16/19 1311   Granulation % 51-75% 09/16/19 1311   Exudate Type      Sero-sanguineous 09/16/19 1311   Exudate Amnt      Moderate 09/16/19 1311   Thickness Full Thickness 09/16/19 1311   Texture Normal For Patient 09/16/19 1311   Moisture Normal For Patient 09/16/19 1311   Temperature WNL 09/16/19 1311   Hypergranuation No 09/16/19 1311   Tunneling      No 09/16/19 1311   Undermining     No 09/16/19 1311   Sinus Tract No 09/16/19 1311   Treatments Pharmaceutical agent;Cleansed/Irrigation 09/16/19 1311   Picture Taken Yes 09/16/19 1311   Length (Pre Debridement) 0.5 cm 09/16/19 1311   Width (Pre Debridement) 2.2 cm 09/16/19 1311   Depth (Pre Debridement) 0.5 cm 09/16/19 1311   Pre-Procedure Pain 0 09/16/19 1311   Time Out Taken Yes 09/16/19 1311   Instrument Used scalpel;curette 09/16/19 1311   Tissue/Material Removed non-viable;slough 09/16/19 1311   Bleeding minimal 09/16/19 1311   Bleeding controlled with pressure 09/16/19 1311   Specimen Taken none 09/16/19 1311   Type of Debridement surgical 09/16/19 1311   Level of Debridement skin, epidermis 09/16/19 1311   Procedural Pain 0 09/16/19 1311   Length (Post-Debridement) 2 cm 09/16/19 1311   Width (Post Debridement) 0.5 cm 09/16/19 1311   Depth (Post-Debridement) 0.7 cm 09/16/19 1311   Post Procedural Pain 0 09/16/19 1311   Area(Pre-Debridement) 1.1 sq cm 09/16/19 1311   Area(Post-Debridement) 1 sq cm 09/16/19 1311   Volume(Pre-Debridement) 0.55 sq cm 09/16/19 1311   Volume(Post-Debridement) 0.7 sq cm 09/16/19 1311            PROCEDURE:  1.  Procedure: right foot, plantar heel   At this time patient's wound was debrided due to the increase in Devitalized tissue of epidermis/dermis, exudate, callus tissue, and other necrotic tissue impairing proper wound healing as reported in the Objective. All benefits, risks, and alternatives were explained in detail with the patient. A time out was taken prior to the procedure. The area was prepped in standard clinic fashion. The wound was anesthetized withlidocaine topical 4% for 15 minute prior to debridement.. An excisional Sharp, surgical debridement was performed to fully debride and remove all central devitalized and necrotic tissue including subcutaneous tissue. The debridement occurred to the level of subcutaneous tissue until  bleeding granulation tissue was observed in 100% of the wound bed.  This was achieved with the use of a curette, forceps and scalpel. Blood loss was minimal.  Hemostasis was achieved with pressure and the post-debridement measurements are documented in the chart. Patient tolerated procedure well at this time.       LABS/IMAGING:   I have personally reviewed pertinent diagnostic studies, including:     A1c on 07/05/19 was 5.8%  Scribe's Attestation: Stephens November, DPM obtained and performed the history, physical exam and medical decision making elements that were  entered into the chart. Documentation assistance was provided by me personally, a scribe. Signed by Dolphus Jenny, Scribe, on September 16, 2019 at 8:33 AM.     ----------------------------------------------------------------------------------------------------------------------  September 22, 2019 4:01 PM. Documentation assistance provided by the Scribe. I was present during the time the encounter was recorded. The information recorded by the Scribe was done at my direction and has been reviewed and validated by me.  ----------------------------------------------------------------------------------------------------------------------

## 2019-09-16 NOTE — Unmapped (Signed)
.  WOUND CARE INSTRUCTIONS:        Wash area with Antibacterial liquid soap with one wash cloth, clean the soap off with a clean cloth, and then pat dry with a dry cloth before the dressing change.    Dressing:     Apply collagen to the wound bed, secure with gauze,kerlix, and ace wrap.       If you have any questions or concerns regarding your wound or wound care, please contact us at the [facility] clinic at [phone].

## 2019-09-17 MED ORDER — CHLORTHALIDONE 25 MG TABLET
ORAL_TABLET | Freq: Every morning | ORAL | 0 refills | 90.00000 days
Start: 2019-09-17 — End: 2020-09-16

## 2019-09-17 NOTE — Unmapped (Signed)
It looks like chlorthalidone was discontinue at discharge from a hospitalization in June. The discharge summary states stop chlorthalidone and begin furosemide. I am not sure of medication plan (if he should be on both or not?)

## 2019-09-18 MED ORDER — CHLORTHALIDONE 25 MG TABLET
ORAL_TABLET | Freq: Every morning | ORAL | 0 refills | 90.00000 days
Start: 2019-09-18 — End: 2020-09-17

## 2019-09-22 DIAGNOSIS — I10 Essential (primary) hypertension: Principal | ICD-10-CM

## 2019-09-22 MED ORDER — CHLORTHALIDONE 25 MG TABLET
ORAL_TABLET | 0 refills | 0.00000 days
Start: 2019-09-22 — End: ?

## 2019-09-24 MED ORDER — LEVOTHYROXINE 100 MCG TABLET
ORAL_TABLET | Freq: Every day | ORAL | 0 refills | 90.00000 days | Status: CP
Start: 2019-09-24 — End: 2020-09-23

## 2019-09-24 NOTE — Unmapped (Signed)
Refill sent to [facility] x 90 days.

## 2019-09-24 NOTE — Unmapped (Signed)
[facility] Return Visit       Assessment and Plan:   1. Right heel ulcer with osteomyelitis and RLE cellulitis, s/p surgical debridement with bone biopsy (DOS 07/09/19). Upon exam today, wound appears healthier and is making great but slow progress. Culture from previous visit grew MRSA. He is currently finished a course of doxy however I will extend his abx for precautionary coverage.      In the interim, patient should cleanse wound with dermal cleanse and apply collagen dressing. Patient is able to wear a normal shoe to his LLE, but should continue to wear heel offloading shoe to RLE; he can wear a normal shoe to RLE when driving only. He should use a walker and wheelchair when going long distances.     Of note, Michael [name] has recently received a full dose of his COVID vaccine.     Recommendations:  - clean wound with dermal cleanse and dress with collagen  - abx prescribed  - continue to offload in heel offloading shoe. Able to transition to normal shoe when driving.   - ambulate using walker or wheelchair when going long distances  - ambulate in offloading shoe to RLE, can wear normal shoe when driving  - f/u 2.5 weeks    Patient instructions on wound care were provided and understood in the End of Visit Summary. All questions were answered to patient's satisfaction today and patient to call the office if worsening of symptoms occurs or any questions arise.     Counseling and/or Coordination of Care Comprised 20 Minutes of which > 50 Percent was face to face interaction involved counseling regarding continued abx use, offloading, dressing changes.       HISTORY OF PRESENT ILLNESS: Michael [name] is a 84 y.o. 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 He is doing well today and has almost finished his antibiotics (doxy, ends 09/26/19). He reports he is not pleased with his current PCP as he reports she has withheld medicine from him.         ROS:   Consitutional: Denies fever, chills or muscle aches   Respiratory: Denies cough, shortness of breath, wheezing  Gastrointestinal: Denies abdominal pain, nausea, vomiting   Musculoskeletal: Denies joint pain, swelling, or musculoskeletal pain  Skin: +ulcer Denies rashes, sores, blisters, growths   Neurological: no numbness or tingling       RX/ ALLERGIES/ MED HX/SURG HX/ SOC HX/FAM HX: Reviewed & updated in Epic      PHYSICAL EXAM:   Consitutional: No acute distress, alert and oriented.  Psychiatric: Alert and Oriented to Person, Place and Time. Pt is Cooperative, Pleasant, and Understanding    Cardiovascular: DP and PT pulses were palpable b/l 2/4, right lower extremity edema noted . Minor telangiectasis noted b/l ankle.   ??  Dermatological: Wound increase in granular tissue, some fibrotic tissue, surrounding peri wound erythema at sole of the foot with no purulence, no pain but increase warmth. Measures  2x0.5x0.7cm, wound progressing very slowly  ??  Neurological: Gross epicritic sensation is intact but reduced.  This was tested with sharp dull sensation patient is unable to distinguish   ??  Musculoskeletal exam shows 4-5 minimal strength of major muscular's function on the foot and ankle bilateral but decreased range of motion ankle and pedal joints stiffness is appreciated.    Wound 07/05/19 Other (comment) Heel Right Right heel surgical  wound (Active)   Wound Image   09/25/19 1300   Wound Status Not Healed 09/25/19 1300   Pain 0 09/25/19 1300   Dressing Status      Removed 09/25/19 1300   Wound Length (cm) 0.6 cm 09/25/19 1300   Wound Width (cm) 1 cm 09/25/19 1300   Wound Depth (cm) 0.5 cm 09/25/19 1300   Wound Surface Area (cm^2) 0.6 cm^2 09/25/19 1300   Wound Volume (cm^3) 0.3 cm^3 09/25/19 1300   Wound Healing % 95 09/25/19 1300   Wound Bed Red 09/25/19 1300   Odor None 09/25/19 1300   Margins Defined edges 09/25/19 1300   Peri-wound Assessment Edema;Callus 09/25/19 1300   Encounter Subsequent 09/25/19 1300   Slough % 1-25% 09/16/19 1311   Granulation % 76-100% 09/25/19 1300   Exudate Type      Sero-sanguineous 09/25/19 1300   Exudate Amnt      Moderate 09/25/19 1300   Thickness Full Thickness 09/25/19 1300   Texture Edema;Callus 09/25/19 1300   Moisture Normal For Patient 09/25/19 1300   Temperature WNL 09/25/19 1300   Hypergranuation No 09/25/19 1300   Tunneling      No 09/25/19 1300   Undermining     No 09/25/19 1300   Sinus Tract No 09/25/19 1300   Treatments Pharmaceutical agent;Cleansed/Irrigation 09/25/19 1300   Picture Taken Yes 09/16/19 1311   Length (Pre Debridement) 0.6 cm 09/25/19 1300   Width (Pre Debridement) 1 cm 09/25/19 1300   Depth (Pre Debridement) 0.5 cm 09/25/19 1300   Pre-Procedure Pain 0 09/25/19 1300   Time Out Taken Yes 09/25/19 1300   Instrument Used curette 09/25/19 1300   Tissue/Material Removed non-viable;slough 09/25/19 1300   Bleeding moderate 09/25/19 1300   Bleeding controlled with pressure 09/25/19 1300   Specimen Taken none 09/25/19 1300   Type of Debridement surgical 09/25/19 1300   Level of Debridement skin, epidermis 09/25/19 1300   Procedural Pain 0 09/25/19 1300   Length (Post-Debridement) 0.6 cm 09/25/19 1300   Width (Post Debridement) 1 cm 09/25/19 1300   Depth (Post-Debridement) 0.5 cm 09/25/19 1300   Post Procedural Pain 0 09/25/19 1300   Area(Pre-Debridement) 0.6 sq cm 09/25/19 1300   Area(Post-Debridement) 0.6 sq cm 09/25/19 1300   Volume(Pre-Debridement) 0.3 sq cm 09/25/19 1300   Volume(Post-Debridement) 0.3 sq cm 09/25/19 1300            PROCEDURE:  1.  Procedure: right foot, plantar heel   At this time patient's wound was debrided due to the increase in Devitalized tissue of epidermis/dermis, exudate, callus tissue, and other necrotic tissue impairing proper wound healing as reported in the Objective. All benefits, risks, and alternatives were explained in detail with the patient. A time out was taken prior to the procedure. The area was prepped in standard clinic fashion. The wound was anesthetized withlidocaine topical 4% for 15 minute prior to debridement.. An excisional Sharp, surgical debridement was performed to fully debride and remove all central devitalized and necrotic tissue including subcutaneous tissue. The debridement occurred to the level of subcutaneous tissue until  bleeding granulation tissue was observed in 100% of the wound bed.  This was achieved with the use of a curette, forceps and scalpel. Blood loss was minimal.  Hemostasis was achieved with pressure and the post-debridement measurements are documented in the chart. Patient tolerated procedure well at this time.     2. Discussed fungal toenails and the treatment and pathophysiology of developing these nails. Discussed nail treatment  every 3 months to aid in resolution of the patient's symptoms and discomfort related to his nails.. Nails debrided 1 through 10 without complication using aseptic technique. Performed by CMA      LABS/IMAGING:   I have personally reviewed pertinent diagnostic studies, including:     A1c on 07/05/19 was 5.8%     Aerobic culture from 09/16/19 grew MRSA      Scribe's Attestation: Stephens November, DPM obtained and performed the history, physical exam and medical decision making elements that were  entered into the chart. Documentation assistance was provided by me personally, a scribe. Signed by Dolphus Jenny, Scribe, on September 25, 2019 at 1:05 PM.     ----------------------------------------------------------------------------------------------------------------------  September 28, 2019 12:43 PM. Documentation assistance provided by the Scribe. I was present during the time the encounter was recorded. The information recorded by the Scribe was done at my direction and has been reviewed and validated by me.  ----------------------------------------------------------------------------------------------------------------------

## 2019-09-24 NOTE — Unmapped (Signed)
Please refill Eutahyrox for thyroid asap. Pt has called and is very anxious

## 2019-09-25 ENCOUNTER — Encounter
Admit: 2019-09-25 | Discharge: 2019-09-26 | Payer: MEDICARE | Attending: Foot & Ankle Surgery | Primary: Foot & Ankle Surgery

## 2019-09-25 DIAGNOSIS — L97412 Non-pressure chronic ulcer of right heel and midfoot with fat layer exposed: Principal | ICD-10-CM

## 2019-09-25 DIAGNOSIS — L0390 Cellulitis, unspecified: Principal | ICD-10-CM

## 2019-09-25 MED ORDER — DOXYCYCLINE HYCLATE 100 MG TABLET
ORAL_TABLET | Freq: Two times a day (BID) | ORAL | 0 refills | 14 days | Status: CP
Start: 2019-09-25 — End: 2019-10-09

## 2019-09-25 MED FILL — XTANDI 40 MG CAPSULE: 30 days supply | Qty: 90 | Fill #9 | Status: AC

## 2019-09-25 MED FILL — XTANDI 40 MG CAPSULE: ORAL | 30 days supply | Qty: 90 | Fill #9

## 2019-09-25 NOTE — Unmapped (Signed)
.  WOUND CARE INSTRUCTIONS:        Wash area with Antibacterial liquid soap with one wash cloth, clean the soap off with a clean cloth, and then pat dry with a dry cloth before the dressing change.    Dressing:     Apply collagen to the wound bed, secure with gauze,kerlix, and ace wrap.       If you have any questions or concerns regarding your wound or wound care, please contact us at the [facility] clinic at [phone].

## 2019-09-25 NOTE — Unmapped (Signed)
[facility] Refill Coordination Note    Specialty Medication(s) to be Shipped:   Hematology/Oncology: Michael [name]    Other medication(s) to be shipped: No additional medications requested for fill at this time     Michael [name], DOB: November 28, 1935  Phone: [phone] (home)       All above HIPAA information was verified with patient's family member, Spouse.     Was a Nurse, learning disability used for this call? No    Completed refill call assessment today to schedule patient's medication shipment from the [facility] [phone]).       Specialty medication(s) and dose(s) confirmed: Regimen is correct and unchanged.   Changes to medications: [name] reports no changes at this time.  Changes to insurance: No  Questions for the pharmacist: No    Confirmed patient received Welcome Packet with first shipment. The patient will receive a drug information handout for each medication shipped and additional FDA Medication Guides as required.       DISEASE/MEDICATION-SPECIFIC INFORMATION        N/A    SPECIALTY MEDICATION ADHERENCE     Medication Adherence    Patient reported X missed doses in the last month: 0  Specialty Medication: Xtandi 40mg   Patient is on additional specialty medications: No  Informant: spouse  Adherence tools used: medication list   Other adherence tool: routine   Support network for adherence: family member                Xtandi 40 mg: 4 days of medicine on hand         SHIPPING     Shipping address confirmed in Epic.     Delivery Scheduled: Yes, Expected medication delivery date: 09/25/19.     Medication will be delivered via Same Day Courier to the prescription address in Epic [place].    Michael [name]   [facility] Specialty Technician

## 2019-10-06 MED ORDER — FUROSEMIDE 20 MG TABLET
ORAL_TABLET | Freq: Every day | ORAL | 0 refills | 30 days | Status: CP
Start: 2019-10-06 — End: ?

## 2019-10-08 ENCOUNTER — Ambulatory Visit
Admit: 2019-10-08 | Discharge: 2019-10-08 | Payer: MEDICARE | Attending: Hematology & Oncology | Primary: Hematology & Oncology

## 2019-10-08 ENCOUNTER — Institutional Professional Consult (permissible substitution): Admit: 2019-10-08 | Discharge: 2019-10-08 | Payer: MEDICARE

## 2019-10-08 ENCOUNTER — Ambulatory Visit: Admit: 2019-10-08 | Discharge: 2019-10-10 | Payer: MEDICARE

## 2019-10-08 ENCOUNTER — Ambulatory Visit: Admit: 2019-10-08 | Discharge: 2019-10-08 | Payer: MEDICARE

## 2019-10-08 ENCOUNTER — Encounter: Admit: 2019-10-08 | Discharge: 2019-10-21 | Payer: MEDICARE

## 2019-10-08 DIAGNOSIS — C775 Secondary and unspecified malignant neoplasm of intrapelvic lymph nodes: Principal | ICD-10-CM

## 2019-10-08 DIAGNOSIS — C7951 Secondary malignant neoplasm of bone: Secondary | ICD-10-CM

## 2019-10-08 DIAGNOSIS — C61 Malignant neoplasm of prostate: Principal | ICD-10-CM

## 2019-10-08 LAB — ALBUMIN: Albumin:MCnc:Pt:Ser/Plas:Qn:: 3.2 — ABNORMAL LOW

## 2019-10-08 LAB — PHOSPHORUS: Phosphate:MCnc:Pt:Ser/Plas:Qn:: 2.3 — ABNORMAL LOW

## 2019-10-08 LAB — EGFR CKD-EPI NON-AA MALE
Glomerular filtration rate/1.73 sq M.predicted.non black:ArVRat:Pt:Ser/Plas/Bld:Qn:Creatinine-based formula (CKD-EPI): 82

## 2019-10-08 LAB — CREATININE: EGFR CKD-EPI AA MALE: >90 mL/min/{1.73_m2} (ref >=60)

## 2019-10-08 LAB — CALCIUM: Calcium:MCnc:Pt:Ser/Plas:Qn:: 8.6 — ABNORMAL LOW

## 2019-10-08 LAB — PROSTATE SPECIFIC ANTIGEN: Prostate specific Ag:MCnc:Pt:Ser/Plas:Qn:: 0.76

## 2019-10-08 MED ADMIN — iohexoL (OMNIPAQUE) 350 mg iodine/mL solution 100 mL: 100 mL | INTRAVENOUS | @ 15:00:00 | Stop: 2019-10-08

## 2019-10-08 MED ADMIN — denosumab (XGEVA) injection 120 mg: 120 mg | SUBCUTANEOUS | @ 20:00:00 | Stop: 2019-10-08

## 2019-10-08 MED ADMIN — Technetium Tc-99m Oxidronate HDP: 25.8 | INTRAVENOUS | @ 13:00:00 | Stop: 2019-10-08

## 2019-10-08 NOTE — Unmapped (Signed)
Reviewed lab. Patient received xgeva injection. Tolerated well. Applied a guaze and a band aid.
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
## 2019-10-14 ENCOUNTER — Ambulatory Visit
Admit: 2019-10-14 | Discharge: 2019-10-15 | Payer: MEDICARE | Attending: Foot & Ankle Surgery | Primary: Foot & Ankle Surgery

## 2019-10-14 DIAGNOSIS — L0390 Cellulitis, unspecified: Principal | ICD-10-CM

## 2019-10-14 DIAGNOSIS — L97412 Non-pressure chronic ulcer of right heel and midfoot with fat layer exposed: Principal | ICD-10-CM

## 2019-10-14 NOTE — Unmapped (Addendum)
.  WOUND CARE INSTRUCTIONS:        Wash area with Antibacterial liquid soap with one wash cloth, clean the soap off with a clean cloth, and then pat dry with a dry cloth before the dressing change.    Dressing: Apply collagen to the wound bed, secure with gauze,kerlix, and ace wrap.       If you have any questions or concerns regarding your wound or wound care, please contact us at the [facility] clinic at [phone].    Stephens November, DPM    10/14/19    If your wound starts to develop the following , please call the [facility] for further advise:    ??  Increased drainage  ??  Redness around the wound  ??  Strong odor from the wound when changing the bandages  ??  Increased pain    Please do not hesitate to leave a voicemail on the nurse line. We make every effort to return your call the same day or the next day. Please leave a clear message with your name, date of birth,  and your medical record number. Leave a brief description of your problem.    If you are experiencing the following, please call us for advise or consider going to the nearest local Emergency Department or call 911.    ??  Fever of 100 F  ??  Nausea or Vomitting  ??  Pus draining from your wound  ??  Redness of the whole foot or leg  ??  Severe increase in pain above your baseline.    [facility]  [phone]

## 2019-10-14 NOTE — Unmapped (Signed)
[facility] Return Visit       Assessment and Plan:   1. Right heel ulcer with osteomyelitis and RLE cellulitis, s/p surgical debridement with bone biopsy (DOS 07/09/19). Continues to slowly heal, with granular wound base and no SOI today. Culture from previous visit grew MRSA, treated with course of doxycycline. We will have patient continue to cleanse wound with dermal cleanse and apply betadine and gauze. Patient is able to wear a normal shoe to his LLE, but should continue to wear heel offloading shoe to RLE; he can wear a normal shoe to RLE when driving only. He should use a walker and wheelchair when going long distances.       Recommendations:  - clean wound with dermal cleanse and dress with betadine, dry gauze  - continue to offload in heel offloading shoe. Able to transition to normal shoe when driving.   - ambulate using walker or wheelchair when going long distances  - f/u 3 weeks    Patient instructions on wound care were provided and understood in the End of Visit Summary. All questions were answered to patient's satisfaction today and patient to call the office if worsening of symptoms occurs or any questions arise.           HISTORY OF PRESENT ILLNESS: Michael [name] is a 84 y.o. 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 Patient reports that area is nearly healed. Denies fever or chills. He was previously dressing area with betadine, but more recently has been letting the wound air out by just applying dry gauze.    ROS:   Consitutional: Denies fever, chills or muscle aches   Respiratory: Denies cough, shortness of breath, wheezing  Gastrointestinal: Denies abdominal pain, nausea, vomiting   Musculoskeletal: Denies joint pain, swelling, or musculoskeletal pain  Skin: +ulcer Denies rashes, sores, blisters, growths   Neurological: no numbness or tingling       RX/ ALLERGIES/ MED HX/SURG HX/ SOC HX/FAM HX: Reviewed & updated in Epic      PHYSICAL EXAM:   Consitutional: No acute distress, alert and oriented.  Psychiatric: Alert and Oriented to Person, Place and Time. Pt is Cooperative, Pleasant, and Understanding    Cardiovascular: DP and PT pulses were palpable b/l 2/4, right lower extremity edema noted . Minor telangiectasis noted b/l ankle.   ??  Dermatological: Wound appears healthy, with granular wound bed post-debridement and no undermining or SOI. Erythema and increased warmth has resolved. Wound progressing very slowly, measures 0.6 x 1.7 x 0.8 cm.  ??  Neurological: Gross epicritic sensation is intact but reduced.  This was tested with sharp dull sensation patient is unable to distinguish   ??  Musculoskeletal exam shows 4-5 minimal strength of major muscular's function on the foot and ankle bilateral but decreased range of motion ankle and pedal joints stiffness is appreciated.    Wound 07/05/19 Other (comment) Heel Right Right heel surgical wound (Active)   Wound Image   10/14/19 1006   Wound Status Not Healed 10/14/19 1006   Pain 0 10/14/19 1006   Dressing Status      Removed 10/14/19 1006   Wound Length (cm) 0.6 cm 10/14/19 1006   Wound Width (cm) 1.7 cm 10/14/19 1006   Wound Depth (cm) 0.8 cm 10/14/19 1006   Wound Surface Area (cm^2) 1.02 cm^2 10/14/19 1006   Wound Volume (cm^3) 0.816 cm^3 10/14/19 1006  Wound Healing % 87 10/14/19 1006   Wound Bed Pink;Yellow 10/14/19 1006   Odor None 10/14/19 1006   Margins Defined edges 10/14/19 1006   Peri-wound Assessment      Edema 10/14/19 1006   Encounter Subsequent 10/14/19 1006   Slough % 26-50% 10/14/19 1006   Granulation % 26-50% 10/14/19 1006   Exudate Type      Sero-sanguineous 10/14/19 1006   Exudate Amnt      Small 10/14/19 1006   Thickness Full Thickness 10/14/19 1006   Texture Edema 10/14/19 1006   Moisture Normal For Patient 10/14/19 1006   Temperature WNL 09/25/19 1300   Hypergranuation No 10/14/19 1006   Tunneling      No 10/14/19 1006   Undermining     No 10/14/19 1006   Sinus Tract No 10/14/19 1006   Treatments Cleansed/Irrigation;Pharmaceutical agent 10/14/19 1006   Picture Taken Yes 10/14/19 1006   Length (Pre Debridement) 0.6 cm 10/14/19 1022   Width (Pre Debridement) 1.7 cm 10/14/19 1022   Depth (Pre Debridement) 0.8 cm 10/14/19 1022   Pre-Procedure Pain 0 10/14/19 1022   Time Out Taken Yes 10/14/19 1022   Instrument Used curette 10/14/19 1022   Tissue/Material Removed non-viable;skin 10/14/19 1022   Bleeding minimal 10/14/19 1022   Bleeding controlled with pressure 10/14/19 1022   Specimen Taken none 10/14/19 1022   Type of Debridement selective 10/14/19 1022   Level of Debridement skin, dermis 10/14/19 1022   Procedural Pain 0 10/14/19 1022   Length (Post-Debridement) 0.6 cm 10/14/19 1022   Width (Post Debridement) 1.7 cm 10/14/19 1022   Depth (Post-Debridement) 0.8 cm 10/14/19 1022   Post Procedural Pain 0 10/14/19 1022   Area(Pre-Debridement) 1.02 sq cm 10/14/19 1022   Area(Post-Debridement) 1.02 sq cm 10/14/19 1022   Volume(Pre-Debridement) 0.82 sq cm 10/14/19 1022   Volume(Post-Debridement) 0.82 sq cm 10/14/19 1022        PROCEDURE:  1.  Procedure: right foot, plantar heel   At this time patient's wound was debrided due to the increase in Devitalized tissue of epidermis/dermis, exudate, callus tissue, and other necrotic tissue impairing proper wound healing as reported in the Objective. All benefits, risks, and alternatives were explained in detail with the patient. A time out was taken prior to the procedure. The area was prepped in standard clinic fashion. The wound was anesthetized withlidocaine topical 4% for 15 minute prior to debridement.. An excisional Sharp, surgical debridement was performed to fully debride and remove all central devitalized and necrotic tissue including subcutaneous tissue. The debridement occurred to the level of subcutaneous tissue until  bleeding granulation tissue was observed in 100% of the wound bed.  This was achieved with the use of a curette, forceps and scalpel. Blood loss was minimal.  Hemostasis was achieved with pressure and the post-debridement measurements are documented in the chart. Patient tolerated procedure well at this time.       LABS/IMAGING:   I have personally reviewed pertinent diagnostic studies, including:     A1c on 07/05/19 was 5.8%     Aerobic culture from 09/16/19 grew MRSA      ______________________________________________________________________    Documentation assistance was provided by Marrianne Mood, Scribe, on October 13, 2019 at 5:11 PM for Stephens November, DPM    ----------------------------------------------------------------------------------------------------------------------  October 15, 2019 12:59 PM. Documentation assistance provided by the Scribe. I was present during the time the encounter was recorded. The information recorded by the Scribe was done at my direction and has  been reviewed and validated by me.  ----------------------------------------------------------------------------------------------------------------------

## 2019-10-21 NOTE — Unmapped (Signed)
Abstraction Result Flowsheet Data    This patient's last AWV date: [facility] Last Medicare Wellness Visit Date: 03/29/2017  This patients last WCC/CPE date: : Not Found      Reason for Encounter  Reason for Encounter: Outreach  Primary Reason for Call: AWV  Outreach Call Outcome: Left message  Text Message: No

## 2019-10-26 NOTE — Unmapped (Signed)
[facility] has made a third and final attempt to reach this patient to refill the following medication:Xtandi 40mg .      We have left voicemails on the following phone numbers: [phone] and have been unable to leave messages on the following phone numbers: [phone].    Dates contacted: 9/30,10/4,11  Last scheduled delivery: 09/25/19    The patient may be at risk of non-compliance with this medication. The patient should call the [facility] at [phone] (option 4) to refill medication.    Michael [name] Michael [name]   [facility] Specialty Technician

## 2019-10-29 NOTE — Unmapped (Signed)
[facility] Refill Coordination Note    Specialty Medication(s) to be Shipped:   Hematology/Oncology: [name]    Other medication(s) to be shipped: No additional medications requested for fill at this time     Michael [name], DOB: Jun 06, 1935  Phone: [phone] (home)       All above HIPAA information was verified with patient.     Was a Nurse, learning disability used for this call? No    Completed refill call assessment today to schedule patient's medication shipment from the [facility] [phone]).       Specialty medication(s) and dose(s) confirmed: Regimen is correct and unchanged.   Changes to medications: Edinson reports no changes at this time.  Changes to insurance: No  Questions for the pharmacist: No    Confirmed patient received Welcome Packet with first shipment. The patient will receive a drug information handout for each medication shipped and additional FDA Medication Guides as required.       DISEASE/MEDICATION-SPECIFIC INFORMATION        N/A    SPECIALTY MEDICATION ADHERENCE     Medication Adherence    Patient reported X missed doses in the last month: 1  Specialty Medication: xtandi 40mg   Patient is on additional specialty medications: No  Patient is on more than two specialty medications: No  Demonstrates understanding of importance of adherence: yes  Informant: patient  Reliability of informant: reliable  Provider-estimated medication adherence level: good  Patient is at risk for Non-Adherence: No  Adherence tools used: medication list   Other adherence tool: routine   Support network for adherence: family member                xtandi 40 mg: 9 days of medicine on hand         SHIPPING     Shipping address confirmed in Epic.     Delivery Scheduled: Yes, Expected medication delivery date: 10/19.     Medication will be delivered via Next Day Courier to the prescription address in Epic WAM.    Michael [name]   [facility] Specialty Technician

## 2019-11-02 MED FILL — XTANDI 40 MG CAPSULE: ORAL | 30 days supply | Qty: 90 | Fill #10

## 2019-11-02 MED FILL — XTANDI 40 MG CAPSULE: 30 days supply | Qty: 90 | Fill #10 | Status: AC

## 2019-11-02 NOTE — Unmapped (Signed)
Podiatry Return Visit       Assessment and Plan:   1. Right heel ulcer with osteomyelitis and RLE cellulitis, s/p surgical debridement with bone biopsy (DOS 07/09/19). Wound similar in appearance with slight blistering to the lateral side. Suspect his wound has plateaued 2/2 the way he walks and friction shear force. The sole of patient's shoes are very worn out, indicating he has a shuffling gait, which is ultimately leading to this shear force. Patient requires a custom insert with material modifications. I will coordinate with prosthetics for adequate orthotic fit and insurance coverage. He has [employer] which is not accepted by many prosthetic shops; I will contact Michael [name] from [facility] in [place] regarding Michael [name] new inserts. In the interim, we will have patient continue to cleanse wound with dermal cleanse and apply betadine and gauze. Patient is able to wear a normal shoe to his LLE, but should continue to wear heel offloading shoe to RLE; he can wear a normal shoe to RLE when driving only. He should use a walker and wheelchair when going long distances.      Recommendations:  - clean wound with dermal cleanse and dress with betadine, dry gauze  - continue to offload in heel offloading shoe. Able to transition to normal shoe when driving.   - will collaborate with Michael [name] from [facility] regarding best fit, insert material and insurance coverage  - ambulate using walker or wheelchair when going long distances  - f/u 2.5 weeks     Patient instructions on wound care were provided and understood in the End of Visit Summary. All questions were answered to patient's satisfaction today and patient to call the office if worsening of symptoms occurs or any questions arise.       HISTORY OF PRESENT ILLNESS: Michael [name] is a 84 y.o. 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 Patient appears in high spirits today, however is unsure how his wound is progressing and has made slow progress. Patient has not been walking barefoot, however does report he has done a lot of driving lately to help take his wife places. He has completed a course of PT prior to his wound.           ROS:   Consitutional: Denies fever, chills or muscle aches   Respiratory: Denies cough, shortness of breath, wheezing  Gastrointestinal: Denies abdominal pain, nausea, vomiting   Musculoskeletal: Denies joint pain, swelling, or musculoskeletal pain  Skin: +ulcer Denies rashes, sores, blisters, growths   Neurological: no numbness or tingling       RX/ ALLERGIES/ MED HX/SURG HX/ SOC HX/FAM HX: Reviewed & updated in Epic      PHYSICAL EXAM:   Consitutional: No acute distress, alert and oriented.  Psychiatric: Alert and Oriented to Person, Place and Time. Pt is Cooperative, Pleasant, and Understanding    Cardiovascular: DP and PT pulses were palpable b/l 2/4, right lower extremity edema noted . Minor telangiectasis noted b/l ankle.   ??  Dermatological: Wound continues to stall in healing with area of lateral blistering. Wound progressing very slowly, measures 2.5x1x0.5cm  ??  Neurological: Gross epicritic sensation is intact but reduced.  This was tested with sharp dull sensation patient is unable to distinguish   ??  Musculoskeletal exam shows 4-5 minimal strength of major muscular's function on the foot and ankle bilateral but decreased range of  motion ankle and pedal joints stiffness is appreciated.    Wound 07/05/19 Other (comment) Heel Right Right heel surgical wound (Active)   Wound Image   11/04/19 0844   Wound Status Not Healed 11/04/19 0844   Pain 0 11/04/19 0844   Dressing Status      Removed 11/04/19 0844   Wound Length (cm) 0.5 cm 11/04/19 0844   Wound Width (cm) 1.7 cm 11/04/19 0844   Wound Depth (cm) 1.1 cm 11/04/19 0844   Wound Surface Area (cm^2) 0.85 cm^2 11/04/19 0844   Wound Volume (cm^3) 0.935 cm^3 11/04/19 0844   Wound Healing % 85 11/04/19 0844   Wound Bed Red;White;Yellow 11/04/19 0844   Odor None 10/14/19 1006   Margins Defined edges 11/04/19 0844   Peri-wound Assessment      Callus;Maceration 11/04/19 0844   Encounter Subsequent 11/04/19 0844   Slough % 1-25% 11/04/19 0844   Granulation % 76-100% 11/04/19 0844   Exudate Type      Sero-sanguineous 11/04/19 0844   Exudate Amnt      Small 11/04/19 0844   Thickness Full Thickness 11/04/19 0844   Exposed Structure N/A 11/04/19 0844   Texture Edema;Callus 11/04/19 0844   Moisture Maceration 11/04/19 0844   Temperature WNL 11/04/19 0844   Hypergranuation No 11/04/19 0844   Tunneling      No 11/04/19 0844   Undermining     Yes 11/04/19 0844   Starting Position (o'clock) 5 11/04/19 0844   Ending Position (o'clock) 8 11/04/19 0844   Max Distance (cm) 0.7 11/04/19 0844   Sinus Tract No 10/14/19 1006   Treatments Cleansed/Irrigation;Pharmaceutical agent 11/04/19 0844   Picture Taken Yes 11/04/19 0844   Length (Pre Debridement) 0.5 cm 11/04/19 0910   Width (Pre Debridement) 1.7 cm 11/04/19 0910   Depth (Pre Debridement) 1.1 cm 11/04/19 0910   Pre-Procedure Pain 0 11/04/19 0910   Time Out Taken Yes 11/04/19 0910   Instrument Used scalpel;curette 11/04/19 0910   Tissue/Material Removed callus;non-viable;slough 11/04/19 0910   Bleeding none 11/04/19 0910   Bleeding controlled with n/a 11/04/19 0910   Specimen Taken none 11/04/19 0910   Type of Debridement selective 10/14/19 1022   Level of Debridement skin, dermis 10/14/19 1022   Procedural Pain 0 11/04/19 0910   Length (Post-Debridement) 1.5 cm 11/04/19 0910   Width (Post Debridement) 1 cm 11/04/19 0910   Depth (Post-Debridement) 0.5 cm 11/04/19 0910   Post Procedural Pain 0 11/04/19 0910   Area(Pre-Debridement) 0.85 sq cm 11/04/19 0910   Area(Post-Debridement) 1.5 sq cm 11/04/19 0910   Volume(Pre-Debridement) 0.94 sq cm 11/04/19 0910   Volume(Post-Debridement) 0.75 sq cm 11/04/19 0910        PROCEDURE:  1.  Procedure: right foot, plantar heel   At this time patient's wound was debrided due to the increase in Devitalized tissue of epidermis/dermis, exudate, callus tissue, and other necrotic tissue impairing proper wound healing as reported in the Objective. All benefits, risks, and alternatives were explained in detail with the patient. A time out was taken prior to the procedure. The area was prepped in standard clinic fashion. The wound was anesthetized withlidocaine topical 4% for 15 minute prior to debridement.. An excisional Sharp, surgical debridement was performed to fully debride and remove all central devitalized and necrotic tissue including subcutaneous tissue. The debridement occurred to the level of subcutaneous tissue until  bleeding granulation tissue was observed in 100% of the wound bed.  This was achieved with the use of a  curette, forceps and scalpel. Blood loss was minimal.  Hemostasis was achieved with pressure and the post-debridement measurements are documented in the chart. Patient tolerated procedure well at this time.       LABS/IMAGING:   I have personally reviewed pertinent diagnostic studies, including:     Aerobic culture from 09/16/19 grew MRSA    Scribe's Attestation: Stephens November, DPM obtained and performed the history, physical exam and medical decision making elements that were  entered into the chart. Documentation assistance was provided by me personally, a scribe. Signed by Dolphus Jenny, Scribe, on November 04, 2019 at 8:23 AM.     ----------------------------------------------------------------------------------------------------------------------  November 04, 2019 12:54 PM. Documentation assistance provided by the Scribe. I was present during the time the encounter was recorded. The information recorded by the Scribe was done at my direction and has been reviewed and validated by me.  ----------------------------------------------------------------------------------------------------------------------

## 2019-11-04 ENCOUNTER — Ambulatory Visit
Admit: 2019-11-04 | Discharge: 2019-11-05 | Payer: MEDICARE | Attending: Foot & Ankle Surgery | Primary: Foot & Ankle Surgery

## 2019-11-04 DIAGNOSIS — M79675 Pain in left toe(s): Principal | ICD-10-CM

## 2019-11-04 DIAGNOSIS — L0390 Cellulitis, unspecified: Principal | ICD-10-CM

## 2019-11-04 DIAGNOSIS — L97412 Non-pressure chronic ulcer of right heel and midfoot with fat layer exposed: Principal | ICD-10-CM

## 2019-11-04 DIAGNOSIS — M79674 Pain in right toe(s): Secondary | ICD-10-CM

## 2019-11-04 DIAGNOSIS — R2681 Unsteadiness on feet: Principal | ICD-10-CM

## 2019-11-04 DIAGNOSIS — B351 Tinea unguium: Principal | ICD-10-CM

## 2019-11-09 ENCOUNTER — Ambulatory Visit: Admit: 2019-11-09 | Discharge: 2019-11-10 | Payer: MEDICARE | Attending: Internal Medicine | Primary: Internal Medicine

## 2019-11-09 DIAGNOSIS — D649 Anemia, unspecified: Principal | ICD-10-CM

## 2019-11-09 DIAGNOSIS — C7951 Secondary malignant neoplasm of bone: Principal | ICD-10-CM

## 2019-11-09 DIAGNOSIS — E034 Atrophy of thyroid (acquired): Principal | ICD-10-CM

## 2019-11-09 DIAGNOSIS — E538 Deficiency of other specified B group vitamins: Principal | ICD-10-CM

## 2019-11-09 DIAGNOSIS — C61 Malignant neoplasm of prostate: Secondary | ICD-10-CM

## 2019-11-09 DIAGNOSIS — Z23 Encounter for immunization: Principal | ICD-10-CM

## 2019-11-09 DIAGNOSIS — I5022 Chronic systolic (congestive) heart failure: Principal | ICD-10-CM

## 2019-11-09 DIAGNOSIS — Z950 Presence of cardiac pacemaker: Principal | ICD-10-CM

## 2019-11-09 DIAGNOSIS — E785 Hyperlipidemia, unspecified: Principal | ICD-10-CM

## 2019-11-09 DIAGNOSIS — I10 Essential (primary) hypertension: Principal | ICD-10-CM

## 2019-11-09 DIAGNOSIS — M48061 Spinal stenosis, lumbar region without neurogenic claudication: Principal | ICD-10-CM

## 2019-11-09 MED ORDER — FUROSEMIDE 20 MG TABLET
ORAL_TABLET | Freq: Every day | ORAL | 0 refills | 30 days | PRN
Start: 2019-11-09 — End: ?
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
## 2019-11-09 NOTE — Unmapped (Addendum)
Labs today    Please get a follow up with a cardiologist     I recommend the Shingrix (Zoster vaccine) which is a 2 part series to help prevent Shingles.  The 2nd vaccine you will receive 2-6 months after the first vaccine.  You can receive this at your local drugstore but call to make sure they have it available and you can ask about the co-pay.  1 in 5 people can have a fever or flu-like symptoms with the vaccine.    It was a pleasure to see you today. If you have any questions, call the [facility] at [phone] during regular business hours, or send me a message through MyChart. Please allow 2 business days for responses to [employer]. For urgent concerns after hours and weekends, please call the [facility] at [phone]     Test results will be released immediately in MyChart. Please allow 2 business days for me to review and interpret them.    Dr. Kelli Churn  [facility]  Phone [phone]  Fax [phone]

## 2019-11-19 NOTE — Unmapped (Signed)
[name] Return Visit       Assessment and Plan:   1. Right heel ulcer with osteomyelitis and RLE cellulitis, s/p surgical debridement with bone biopsy (DOS 07/09/19). His wound has improved greatly with use of patient's self offloading padding in tennis shoes. He can continue this method of offloading as his wound appears greatly improved and smaller on exam today compared to last visit. We will hold off on custom inserts for now. In the interim, we will have patient continue his same dressings as he has been making great progress. He should use a walker and wheelchair when going long distances.      Recommendations:  - dressings in AVS  - continue to offload with padding in normal tennis shoes   - will collaborate with [facility] regarding best fit, insert material and insurance coverage if needed or patient stalls in healing   - ambulate using walker or wheelchair when going long distances  - f/u 2.5 weeks     Patient instructions on wound care were provided and understood in the End of Visit Summary. All questions were answered to patient's satisfaction today and patient to call the office if worsening of symptoms occurs or any questions arise.       HISTORY OF PRESENT ILLNESS: Michael [name] is a 84 y.o. 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 He is doing very well today and has been wearing normal tennis shoes for offloading with an abd pad to elevate his forefoot and offload the wound area. He reports his wound has improved with use of self padding.       ROS:   Consitutional: Denies fever, chills or muscle aches   Respiratory: Denies cough, shortness of breath, wheezing  Gastrointestinal: Denies abdominal pain, nausea, vomiting   Musculoskeletal: Denies joint pain, swelling, or musculoskeletal pain  Skin: +ulcer Denies rashes, sores, blisters, growths   Neurological: no numbness or tingling       RX/ ALLERGIES/ MED HX/SURG HX/ SOC HX/FAM HX: Reviewed & updated in Epic      PHYSICAL EXAM:   Consitutional: No acute distress, alert and oriented.  Psychiatric: Alert and Oriented to Person, Place and Time. Pt is Cooperative, Pleasant, and Understanding    Cardiovascular: DP and PT pulses were palpable b/l 2/4, right lower extremity edema noted . Minor telangiectasis noted b/l ankle.   ??  Dermatological: Wound continues to stall in healing with area of lateral blistering. Wound progressing very slowly, measures 2.0x0.7x0.1cm  ??  Neurological: Gross epicritic sensation is intact but reduced.  This was tested with sharp dull sensation patient is unable to distinguish   ??  Musculoskeletal exam shows 4-5 minimal strength of major muscular's function on the foot and ankle bilateral but decreased range of motion ankle and pedal joints stiffness is appreciated.    Wound 07/05/19 Other (comment) Heel Right Right heel surgical wound (Active)   Wound Image    11/20/19 1104   Wound Status Not Healed 11/20/19 1104   Pain 0 11/20/19 1104   Dressing Status      Removed 11/04/19 0844   Wound Length (cm) 0.5 cm 11/20/19 1104   Wound Width (cm) 1.8 cm 11/20/19 1104   Wound Depth (cm) 0.5 cm 11/20/19 1104   Wound Surface Area (cm^2) 0.9 cm^2 11/20/19 1104   Wound Volume (cm^3) 0.45 cm^3 11/20/19 1104   Wound  Healing % 93 11/20/19 1104   Wound Bed Red 11/20/19 1104   Odor None 11/20/19 1104   Margins Undefined edges 11/20/19 1104   Peri-wound Assessment      Callus;Edema;Maceration 11/20/19 1104   Encounter Subsequent 11/20/19 1104   Progress Initial Exam 11/20/19 1104   Slough % 1-25% 11/20/19 1104   Granulation % 51-75% 11/20/19 1104   Exudate Type      Sero-sanguineous 11/20/19 1104   Exudate Amnt      Moderate 11/20/19 1104   Thickness Full Thickness 11/20/19 1104   Exposed Structure N/A 11/20/19 1104   Texture Edema 11/20/19 1104   Moisture Maceration 11/20/19 1104   Temperature WNL 11/20/19 1104 Hypergranuation No 11/20/19 1104   Tunneling      No 11/20/19 1104   Undermining     No 11/20/19 1104   Starting Position (o'clock) 5 11/04/19 0844   Ending Position (o'clock) 8 11/04/19 0844   Max Distance (cm) 0.7 11/04/19 0844   Sinus Tract No 11/20/19 1104   Treatments Cleansed/Irrigation;Pharmaceutical agent 11/20/19 1104   Picture Taken Yes 11/20/19 1104   Length (Pre Debridement) 0.5 cm 11/20/19 1159   Width (Pre Debridement) 1.8 cm 11/20/19 1159   Depth (Pre Debridement) 0.5 cm 11/20/19 1159   Pre-Procedure Pain 0 11/20/19 1159   Time Out Taken Yes 11/20/19 1159   Instrument Used curette;scalpel 11/20/19 1159   Tissue/Material Removed non-viable;callus 11/20/19 1159   Bleeding minimal 11/20/19 1159   Bleeding controlled with pressure 11/20/19 1159   Specimen Taken none 11/20/19 1159   Type of Debridement selective 11/20/19 1159   Level of Debridement skin, dermis 11/20/19 1159   Procedural Pain 0 11/20/19 1159   Length (Post-Debridement) 2 cm 11/20/19 1159   Width (Post Debridement) 0.7 cm 11/20/19 1159   Depth (Post-Debridement) 1 cm 11/20/19 1159   Post Procedural Pain 0 11/20/19 1159   Area(Pre-Debridement) 0.9 sq cm 11/20/19 1159   Area(Post-Debridement) 1.4 sq cm 11/20/19 1159   Volume(Pre-Debridement) 0.45 sq cm 11/20/19 1159   Volume(Post-Debridement) 1.4 sq cm 11/20/19 1159        PROCEDURE:  1.  Procedure: right foot, plantar heel   At this time patient's wound was debrided due to the increase in Devitalized tissue of epidermis/dermis, exudate, callus tissue, and other necrotic tissue impairing proper wound healing as reported in the Objective. All benefits, risks, and alternatives were explained in detail with the patient. A time out was taken prior to the procedure. The area was prepped in standard clinic fashion. The wound was anesthetized withlidocaine topical 4% for 15 minute prior to debridement.. An excisional Sharp, surgical debridement was performed to fully debride and remove all central devitalized and necrotic tissue including subcutaneous tissue. The debridement occurred to the level of subcutaneous tissue until  bleeding granulation tissue was observed in 100% of the wound bed.  This was achieved with the use of a curette, forceps and scalpel. Blood loss was minimal.  Hemostasis was achieved with pressure and the post-debridement measurements are documented in the chart. Patient tolerated procedure well at this time.       LABS/IMAGING:   I have personally reviewed pertinent diagnostic studies.      Scribe's Attestation: Stephens November, DPM obtained and performed the history, physical exam and medical decision making elements that were  entered into the chart. Documentation assistance was provided by me personally, a scribe. Signed by Dolphus Jenny, Scribe, on November 20, 2019 at 8:26 AM.     ----------------------------------------------------------------------------------------------------------------------  November 25, 2019 1:23 PM. Documentation assistance provided by the Scribe. I was present during the time the encounter was recorded. The information recorded by the Scribe was done at my direction and has been reviewed and validated by me.  ----------------------------------------------------------------------------------------------------------------------

## 2019-11-20 ENCOUNTER — Encounter
Admit: 2019-11-20 | Discharge: 2019-11-21 | Payer: MEDICARE | Attending: Foot & Ankle Surgery | Primary: Foot & Ankle Surgery

## 2019-11-20 DIAGNOSIS — L03115 Cellulitis of right lower limb: Principal | ICD-10-CM

## 2019-11-20 DIAGNOSIS — E039 Hypothyroidism, unspecified: Principal | ICD-10-CM

## 2019-11-20 DIAGNOSIS — C779 Secondary and unspecified malignant neoplasm of lymph node, unspecified: Principal | ICD-10-CM

## 2019-11-20 DIAGNOSIS — I110 Hypertensive heart disease with heart failure: Principal | ICD-10-CM

## 2019-11-20 DIAGNOSIS — R2681 Unsteadiness on feet: Principal | ICD-10-CM

## 2019-11-20 DIAGNOSIS — B351 Tinea unguium: Principal | ICD-10-CM

## 2019-11-20 DIAGNOSIS — M79675 Pain in left toe(s): Principal | ICD-10-CM

## 2019-11-20 DIAGNOSIS — C61 Malignant neoplasm of prostate: Principal | ICD-10-CM

## 2019-11-20 DIAGNOSIS — L0390 Cellulitis, unspecified: Principal | ICD-10-CM

## 2019-11-20 DIAGNOSIS — M79674 Pain in right toe(s): Principal | ICD-10-CM

## 2019-11-20 DIAGNOSIS — I509 Heart failure, unspecified: Principal | ICD-10-CM

## 2019-11-20 DIAGNOSIS — L97412 Non-pressure chronic ulcer of right heel and midfoot with fat layer exposed: Principal | ICD-10-CM

## 2019-11-20 DIAGNOSIS — I781 Nevus, non-neoplastic: Principal | ICD-10-CM

## 2019-11-20 DIAGNOSIS — M869 Osteomyelitis, unspecified: Principal | ICD-10-CM

## 2019-11-20 NOTE — Unmapped (Addendum)
WOUND CARE INSTRUCTIONS:      Wash area with Antibacterial liquid soap with one wash cloth, clean the soap off with a clean cloth, and then pat dry with a dry cloth before the dressing change.    Dressing: Apply betadine to the wound bed, secure with gauze,kerlix, and ace wrap.     If you have any questions or concerns regarding your wound or wound care, please contact us at the [facility] clinic at [phone].    Stephens November, DPM    11/20/19    If your wound starts to develop the following , please call the [facility] for further advise:    ??  Increased drainage  ??  Redness around the wound  ??  Strong odor from the wound when changing the bandages  ??  Increased pain    Please do not hesitate to leave a voicemail on the nurse line. We make every effort to return your call the same day or the next day. Please leave a clear message with your name, date of birth,  and your medical record number. Leave a brief description of your problem.    If you are experiencing the following, please call us for advise or consider going to the nearest local Emergency Department or call 911.    ??  Fever of 100 F  ??  Nausea or Vomitting  ??  Pus draining from your wound  ??  Redness of the whole foot or leg  ??  Severe increase in pain above your baseline.    [facility]  [phone]

## 2019-11-23 MED ORDER — GABAPENTIN 300 MG CAPSULE
ORAL_CAPSULE | 0 refills | 0 days
Start: 2019-11-23 — End: ?

## 2019-11-23 NOTE — Unmapped (Signed)
Not our patient  Next OV: Visit date not found.

## 2019-11-24 MED ORDER — GABAPENTIN 300 MG CAPSULE
ORAL_CAPSULE | Freq: Every evening | ORAL | 3 refills | 90 days | Status: CP
Start: 2019-11-24 — End: ?

## 2019-11-27 NOTE — Unmapped (Signed)
[facility] Clinical Assessment & Refill Coordination Note    Michael [name], DOB: Feb 26, 1935  Phone: [phone] (home)     All above HIPAA information was verified with patient.     Was a Nurse, learning disability used for this call? No    Specialty Medication(s):   Hematology/Oncology: [name]     Current Outpatient Medications   Medication Sig Dispense Refill   ??? acetaminophen (TYLENOL) 325 MG tablet Take 325 mg by mouth two (2) times a day.      ??? aspirin (ECOTRIN) 81 MG tablet Take 81 mg by mouth daily.     ??? b complex vitamins capsule Take 1 capsule by mouth daily.     ??? CALCIUM CARBONATE/VITAMIN D3 (CALCIUM 600 WITH VITAMIN D3 ORAL) Take 1 tablet by mouth Two (2) times a day.      ??? CHOLECALCIFEROL, VITAMIN D3, (VITAMIN D3 ORAL) Take 1 capsule by mouth once daily. Unsure of dose     ??? DAKIN'S SOLUTION 0.25 % external solution APPLY TO THE RIGHT HEEL TOPICALLY EVERY EVENING SHIFT EVERY OTHER DAY FOR WOUND AREA WITH DAKINS WITH 1 WASH CLOTH CLEAN THE DAKINS OFF WITH A CLEAN CLOTH AND PAT DRY WITH A DRY CLOTH BEFORE THE DRESSING CHANGE     ??? enoxaparin (LOVENOX) 40 mg/0.4 mL Syrg Inject 0.4 mL (40 mg total) under the skin daily.  0   ??? enzalutamide (XTANDI) 40 mg capsule Take 3 capsules (120 mg total) by mouth daily. 90 each 11   ??? furosemide (LASIX) 20 MG tablet Take 1 tablet (20 mg total) by mouth daily as needed for swelling. Please hold for systolic BP </= 90 30 tablet 0   ??? gabapentin (NEURONTIN) 300 MG capsule Take 1 capsule (300 mg total) by mouth nightly. 90 capsule 3   ??? hydrocolloid dressing 2 X 2  Bndg Apply to wound bed (open ulcer) of right heel every 24-48 hours. 10 each 0   ??? levothyroxine (EUTHYROX) 100 MCG tablet Take 1 tablet (100 mcg total) by mouth daily. 90 tablet 0   ??? lovastatin (MEVACOR) 40 MG tablet Take 1 tablet (40 mg total) by mouth every evening. 90 tablet 1   ??? vitamin B comp and C no.3 15-10-50-5-300 mg TbER Take 1 tablet by mouth once daily.        No current facility-administered medications for this visit.        Changes to medications: Michael [name] reports no changes at this time.    No Known Allergies    Changes to allergies: No    SPECIALTY MEDICATION ADHERENCE     Xtandi 120 mg: 10 days of medicine on hand       Medication Adherence    Patient reported X missed doses in the last month: 0  Specialty Medication: Xtandi 40mg : 3 capsules daily  Informant: patient  Adherence tools used: medication list   Other adherence tool: routine   Support network for adherence: family member          Specialty medication(s) dose(s) confirmed: Regimen is correct and unchanged.     Are there any concerns with adherence? No    Adherence counseling provided? Not needed    CLINICAL MANAGEMENT AND INTERVENTION      Clinical Benefit Assessment:    Do you feel the medicine is effective or helping your condition? Yes    Clinical Benefit counseling provided? Not needed    Adverse Effects Assessment:    Are you experiencing any  side effects? No    Are you experiencing difficulty administering your medicine? No    Quality of Life Assessment:    How many days over the past month did your prostate cancer  keep you from your normal activities? For example, brushing your teeth or getting up in the morning. Patient declined to answer    Have you discussed this with your provider? Not needed    Therapy Appropriateness:    Is therapy appropriate? Yes, therapy is appropriate and should be continued    DISEASE/MEDICATION-SPECIFIC INFORMATION      N/A    PATIENT SPECIFIC NEEDS     - Does the patient have any physical, cognitive, or cultural barriers? No    - Is the patient high risk? Yes, patient is taking oral chemotherapy. Appropriateness of therapy as been assessed    - Does the patient require a Care Management Plan? No     - Does the patient require physician intervention or other additional services (i.e. nutrition, smoking cessation, social work)? No      SHIPPING     Specialty Medication(s) to be Shipped:   Hematology/Oncology: [name]    Other medication(s) to be shipped: No additional medications requested for fill at this time     Changes to insurance: No    Delivery Scheduled: Yes, Expected medication delivery date: 12/02/19.     Medication will be delivered via Next Day Courier to the confirmed prescription address in [facility].    The patient will receive a drug information handout for each medication shipped and additional FDA Medication Guides as required.  Verified that patient has previously received a Conservation officer, historic buildings.    All of the patient's questions and concerns have been addressed.    Michael [name]   [facility] Specialty Pharmacist    I reviewed this patient case and all documentation provided by the learner and was readily available for consultation during their interaction with the patient.  I agree with the assessment and plan listed below.    Breck Coons [facility] Specialty Pharmacist

## 2019-12-01 MED FILL — XTANDI 40 MG CAPSULE: ORAL | 30 days supply | Qty: 90 | Fill #11

## 2019-12-01 MED FILL — XTANDI 40 MG CAPSULE: 30 days supply | Qty: 90 | Fill #11 | Status: AC

## 2019-12-09 ENCOUNTER — Ambulatory Visit
Admit: 2019-12-09 | Discharge: 2019-12-10 | Payer: MEDICARE | Attending: Foot & Ankle Surgery | Primary: Foot & Ankle Surgery

## 2019-12-09 DIAGNOSIS — M86671 Other chronic osteomyelitis, right ankle and foot: Principal | ICD-10-CM

## 2019-12-09 DIAGNOSIS — Z9889 Other specified postprocedural states: Principal | ICD-10-CM

## 2019-12-09 DIAGNOSIS — L97412 Non-pressure chronic ulcer of right heel and midfoot with fat layer exposed: Principal | ICD-10-CM

## 2019-12-09 DIAGNOSIS — L03115 Cellulitis of right lower limb: Principal | ICD-10-CM

## 2019-12-09 NOTE — Unmapped (Signed)
WOUND CARE INSTRUCTIONS:      Wash area with Antibacterial liquid soap with one wash cloth, clean the soap off with a clean cloth, and then pat dry with a dry cloth before the dressing change.    Dressing: Apply iodoform packing to the wound bed, secure with gauze,kerlix, and ace wrap.     If you have any questions or concerns regarding your wound or wound care, please contact us at the [facility] clinic at [phone].    Stephens November, DPM    12/09/19    If your wound starts to develop the following , please call the [facility] for further advise:    ??  Increased drainage  ??  Redness around the wound  ??  Strong odor from the wound when changing the bandages  ??  Increased pain    Please do not hesitate to leave a voicemail on the nurse line. We make every effort to return your call the same day or the next day. Please leave a clear message with your name, date of birth,  and your medical record number. Leave a brief description of your problem.    If you are experiencing the following, please call us for advise or consider going to the nearest local Emergency Department or call 911.    ??  Fever of 100 F  ??  Nausea or Vomitting  ??  Pus draining from your wound  ??  Redness of the whole foot or leg  ??  Severe increase in pain above your baseline.    [facility]  [phone]

## 2019-12-09 NOTE — Unmapped (Signed)
Podiatry Return Visit       Assessment and Plan:   1. Right heel ulcer with osteomyelitis and RLE cellulitis, s/p surgical debridement with bone biopsy (DOS 07/09/19). His wound has improved greatly with use of patient's self offloading padding in tennis shoes, which he can continue for now. Patient instructed to start iodoform packing with hope of decreasing depth. Encouraged to use a walker/wheelchair when going long distances     Recommendations:  - Start packing wound with iodoform, instructions provided in the AVS   - continue to offload with padding in normal tennis shoes   - ambulate using walker or wheelchair when going long distances     Patient instructions on wound care were provided and understood in the End of Visit Summary. All questions were answered to patient's satisfaction today and patient to call the office if worsening of symptoms occurs or any questions arise.       HISTORY OF PRESENT ILLNESS: Michael [name] is a 84 y.o. 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     He is doing well today, without complaints or new concern. Denies recent ED visits or hospitalizations. We recommended walker at last visit to help with ambulation, however patient has not yet ordered this. Dressing wound with betadine and gauze.     ROS:   Consitutional: Denies fever, chills or muscle aches   Respiratory: Denies cough, shortness of breath, wheezing  Gastrointestinal: Denies abdominal pain, nausea, vomiting   Musculoskeletal: Denies joint pain, swelling, or musculoskeletal pain  Skin: +ulcer Denies rashes, sores, blisters, growths   Neurological: no numbness or tingling       RX/ ALLERGIES/ MED HX/SURG HX/ SOC HX/FAM HX: Reviewed & updated in Epic      PHYSICAL EXAM:   Consitutional: No acute distress, alert and oriented.  Psychiatric: Alert and Oriented to Person, Place and Time. Pt is Cooperative, Pleasant, and Understanding    Cardiovascular: DP and PT pulses were palpable b/l 2/4, right lower extremity edema noted . Minor telangiectasis noted b/l ankle.   ??  Dermatological: Wound continues to stall in healing with area of lateral blistering. Wound progressing very slowly, measures 1.5 x 0.5 x 0.6 cm   ??  Neurological: Gross epicritic sensation is intact but reduced. This was tested with sharp dull sensation patient is unable to distinguish   ??  Musculoskeletal exam shows 4-5 minimal strength of major muscular's function on the foot and ankle bilateral but decreased range of motion ankle and pedal joints stiffness is appreciated.    Wound 07/05/19 Other (comment) Heel Right Right heel surgical wound (Active)   Wound Image   12/09/19 1033   Wound Status Not Healed 12/09/19 1033   Pain 0 12/09/19 1033   Dressing Status      Removed;Other (Comment) 12/09/19 1033   Wound Length (cm) 0.1 cm 12/09/19 1033   Wound Width (cm) 0.7 cm 12/09/19 1033   Wound Depth (cm) 0.5 cm 12/09/19 1033   Wound Surface Area (cm^2) 0.07 cm^2 12/09/19 1033   Wound Volume (cm^3) 0.035 cm^3 12/09/19 1033   Wound Healing % 99 12/09/19 1033   Wound Bed Red 11/20/19 1104   Odor None 12/09/19 1033   Margins Defined edges 12/09/19 1033   Peri-wound Assessment      Clean 12/09/19 1033   Encounter Subsequent 12/09/19 1033   Progress Initial Exam 11/20/19 1104  Slough % 76-100% 12/09/19 1033   Granulation % 51-75% 11/20/19 1104   Exudate Type      Yellow 12/09/19 1033   Exudate Amnt      Small 12/09/19 1033   Thickness Full Thickness 12/09/19 1033   Exposed Structure N/A 11/20/19 1104   Paring No 12/09/19 1033   Texture Callus 12/09/19 1033   Moisture Normal For Patient 12/09/19 1033   Temperature WNL 11/20/19 1104   Hypergranuation No 12/09/19 1033   Tunneling      No 12/09/19 1033   Undermining     No 12/09/19 1033   Sinus Tract No 12/09/19 1033   Treatments Cleansed/Irrigation;Pharmaceutical agent;Other (Comment) 12/09/19 1033   Picture Taken Yes 12/09/19 1033   Length (Pre Debridement) 0.1 cm 12/09/19 1110   Width (Pre Debridement) 0.7 cm 12/09/19 1110   Depth (Pre Debridement) 0.5 cm 12/09/19 1110   Pre-Procedure Pain 0 12/09/19 1110   Time Out Taken Yes 12/09/19 1110   Instrument Used curette;scalpel 12/09/19 1110   Tissue/Material Removed non-viable;callus;slough;biofilm 12/09/19 1110   Bleeding minimal 12/09/19 1110   Bleeding controlled with pressure 12/09/19 1110   Specimen Taken none 12/09/19 1110   Type of Debridement selective 12/09/19 1110   Level of Debridement subcutaneous tissue 12/09/19 1110   Procedural Pain 0 11/20/19 1159   Length (Post-Debridement) 0.1 cm 12/09/19 1110   Width (Post Debridement) 0.7 cm 12/09/19 1110   Depth (Post-Debridement) 0.5 cm 12/09/19 1110   Post Procedural Pain 0 12/09/19 1110   Area(Pre-Debridement) 0.07 sq cm 12/09/19 1110   Area(Post-Debridement) 0.07 sq cm 12/09/19 1110   Volume(Pre-Debridement) 0.04 sq cm 12/09/19 1110   Volume(Post-Debridement) 0.04 sq cm 12/09/19 1110          PROCEDURE:  1.  Procedure: right foot, plantar heel   At this time patient's wound was debrided due to the increase in Devitalized tissue of epidermis/dermis, exudate, callus tissue, and other necrotic tissue impairing proper wound healing as reported in the Objective. All benefits, risks, and alternatives were explained in detail with the patient. A time out was taken prior to the procedure. The area was prepped in standard clinic fashion. The wound was anesthetized withlidocaine topical 4% for 15 minute prior to debridement.. An excisional Sharp, surgical debridement was performed to fully debride and remove all central devitalized and necrotic tissue including subcutaneous tissue. The debridement occurred to the level of subcutaneous tissue until  bleeding granulation tissue was observed in 100% of the wound bed.  This was achieved with the use of a curette, forceps and scalpel. Blood loss was minimal.  Hemostasis was achieved with pressure and the post-debridement measurements are documented in the chart. Patient tolerated procedure well at this time.       LABS/IMAGING:   I have personally reviewed pertinent diagnostic studies.    Scribe's Attestation: Stephens November, DPM obtained and performed the history, physical exam and medical decision making elements that were entered into the chart.  Signed by Jerl Santos, Scribe, on December 09, 2019 11:25 AM    ----------------------------------------------------------------------------------------------------------------------  December 16, 2019 4:47 PM. Documentation assistance provided by the Scribe. I was present during the time the encounter was recorded. The information recorded by the Scribe was done at my direction and has been reviewed and validated by me.  ----------------------------------------------------------------------------------------------------------------------

## 2019-12-17 MED ORDER — LEVOTHYROXINE 100 MCG TABLET
ORAL_TABLET | Freq: Every day | ORAL | 3 refills | 90.00000 days | Status: CP
Start: 2019-12-17 — End: 2020-12-16

## 2019-12-17 MED ORDER — LOVASTATIN 40 MG TABLET
ORAL_TABLET | Freq: Every evening | ORAL | 3 refills | 90 days | Status: CP
Start: 2019-12-17 — End: 2020-12-16

## 2019-12-17 NOTE — Unmapped (Signed)
Patient Requests Medication Refill    ??? Name of medication: Levothyroxine, Lovastatin  ??? Desired pharmacy:  o Name: [facility]  o Location/ phone number: [phone]  ??? Days supply requested: 90  ??? How many days supply do you have left?: 0  ??? Last appointment with PCP: 11/09/2019 (If more than a year, offer an appointment.)

## 2019-12-23 DIAGNOSIS — C61 Malignant neoplasm of prostate: Principal | ICD-10-CM

## 2019-12-24 ENCOUNTER — Encounter
Admit: 2019-12-24 | Discharge: 2019-12-24 | Payer: MEDICARE | Attending: Hematology & Oncology | Primary: Hematology & Oncology

## 2019-12-24 ENCOUNTER — Encounter: Admit: 2019-12-24 | Discharge: 2019-12-24 | Payer: MEDICARE

## 2019-12-24 DIAGNOSIS — Z5111 Encounter for antineoplastic chemotherapy: Principal | ICD-10-CM

## 2019-12-24 DIAGNOSIS — E785 Hyperlipidemia, unspecified: Principal | ICD-10-CM

## 2019-12-24 DIAGNOSIS — M47816 Spondylosis without myelopathy or radiculopathy, lumbar region: Principal | ICD-10-CM

## 2019-12-24 DIAGNOSIS — Z950 Presence of cardiac pacemaker: Principal | ICD-10-CM

## 2019-12-24 DIAGNOSIS — Z7901 Long term (current) use of anticoagulants: Principal | ICD-10-CM

## 2019-12-24 DIAGNOSIS — Z79890 Hormone replacement therapy (postmenopausal): Principal | ICD-10-CM

## 2019-12-24 DIAGNOSIS — Z86718 Personal history of other venous thrombosis and embolism: Principal | ICD-10-CM

## 2019-12-24 DIAGNOSIS — M4807 Spinal stenosis, lumbosacral region: Principal | ICD-10-CM

## 2019-12-24 DIAGNOSIS — I110 Hypertensive heart disease with heart failure: Principal | ICD-10-CM

## 2019-12-24 DIAGNOSIS — M898X9 Other specified disorders of bone, unspecified site: Principal | ICD-10-CM

## 2019-12-24 DIAGNOSIS — C61 Malignant neoplasm of prostate: Principal | ICD-10-CM

## 2019-12-24 DIAGNOSIS — C7951 Secondary malignant neoplasm of bone: Principal | ICD-10-CM

## 2019-12-24 DIAGNOSIS — R590 Localized enlarged lymph nodes: Principal | ICD-10-CM

## 2019-12-24 DIAGNOSIS — C775 Secondary and unspecified malignant neoplasm of intrapelvic lymph nodes: Principal | ICD-10-CM

## 2019-12-24 DIAGNOSIS — I509 Heart failure, unspecified: Principal | ICD-10-CM

## 2019-12-24 DIAGNOSIS — Z7982 Long term (current) use of aspirin: Principal | ICD-10-CM

## 2019-12-24 LAB — ALBUMIN: ALBUMIN: 3.1 g/dL — ABNORMAL LOW (ref 3.4–5.0)

## 2019-12-24 LAB — CREATININE
CREATININE: 0.7 mg/dL
EGFR CKD-EPI AA MALE: >90 mL/min/{1.73_m2} (ref >=60)
EGFR CKD-EPI NON-AA MALE: 88 mL/min/{1.73_m2} (ref >=60)

## 2019-12-24 LAB — PSA: PROSTATE SPECIFIC ANTIGEN: 0.87 ng/mL (ref 0.00–4.00)

## 2019-12-24 LAB — PHOSPHORUS: PHOSPHORUS: 3 mg/dL (ref 2.4–5.1)

## 2019-12-24 LAB — CALCIUM: CALCIUM: 8.8 mg/dL (ref 8.7–10.4)

## 2019-12-24 MED ADMIN — leuprolide (6 month) (ELIGARD) injection 45 mg: 45 mg | SUBCUTANEOUS | @ 20:00:00 | Stop: 2019-12-24

## 2019-12-24 MED ADMIN — denosumab (XGEVA) injection 120 mg: 120 mg | SUBCUTANEOUS | @ 20:00:00 | Stop: 2019-12-24

## 2019-12-24 NOTE — Unmapped (Signed)
Labs reviewed. Patient received Eligard 45mg   SQ in Injection site: right lower abdomen and xgeva given in left arm. Band aid and guaze applied. Patient tolerated injection without complications and ambulated to checkout without any assistance
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
## 2019-12-24 NOTE — Unmapped (Addendum)
Lab Results   Component Value Date    PSA 0.87 12/24/2019    PSA 0.76 10/08/2019    PSA 0.40 06/23/2019    PSA 0.25 04/10/2019    PSA 0.14 02/03/2019    PSA 0.10 12/23/2018   PSA rising only VERY slowly, so this is good. Continue with current therapy of enzalutamide.  Eligard and denosumab injections today.  Scans prior to next visit in 3 months.    Please call [phone] to reach my nurse navigator Mauricia Area for any issues.    For emergencies on Nights, Weekends and Holidays  Call [phone] and ask for the hematology/oncology on call.    Griffin Basil, MD, PhD  Associate Professor of Medicine  Division of Hematology-Oncology    [facility]  Genitourinary Oncology Clinic  Nurse Navigator: Mauricia Area  Fax: [phone]

## 2019-12-25 DIAGNOSIS — C61 Malignant neoplasm of prostate: Principal | ICD-10-CM

## 2019-12-25 MED ORDER — XTANDI 40 MG CAPSULE
Freq: Every day | ORAL | 0 days | Status: CN
Start: 2019-12-25 — End: ?

## 2019-12-28 DIAGNOSIS — C61 Malignant neoplasm of prostate: Principal | ICD-10-CM

## 2019-12-28 MED ORDER — XTANDI 40 MG CAPSULE
ORAL_CAPSULE | Freq: Every day | ORAL | 11 refills | 30 days | Status: CP
Start: 2019-12-28 — End: ?

## 2019-12-28 NOTE — Unmapped (Signed)
[facility] Refill Coordination Note    Specialty Medication(s) to be Shipped:   Hematology/Oncology: [name]    Other medication(s) to be shipped: No additional medications requested for fill at this time     Michael [name], DOB: Aug 29, 1935  Phone: [phone] (home)       All above HIPAA information was verified with patient.     Was a Nurse, learning disability used for this call? No    Completed refill call assessment today to schedule patient's medication shipment from the [facility] [phone]).       Specialty medication(s) and dose(s) confirmed: Regimen is correct and unchanged.   Changes to medications: [name] reports no changes at this time.  Changes to insurance: No  Questions for the pharmacist: No    Confirmed patient received Welcome Packet with first shipment. The patient will receive a drug information handout for each medication shipped and additional FDA Medication Guides as required.       DISEASE/MEDICATION-SPECIFIC INFORMATION        N/A    SPECIALTY MEDICATION ADHERENCE     Medication Adherence    Patient reported X missed doses in the last month: 0  Specialty Medication: Xtandi 40mg   Patient is on additional specialty medications: No  Informant: patient  Adherence tools used: medication list   Other adherence tool: routine   Support network for adherence: family member                Xtandi 40 mg: 14  days of medicine on hand         SHIPPING     Shipping address confirmed in Epic.     Delivery Scheduled: Yes, Expected medication delivery date: 01/06/20.  However, Rx request for refills was sent to the provider as there are none remaining.     Medication will be delivered via Next Day Courier to the prescription address in Epic [place].    Michael [name]   [facility] Specialty Technician

## 2019-12-31 NOTE — Unmapped (Signed)
[facility] Return Visit       Assessment and Plan:   1. Right heel ulcer with osteomyelitis and RLE cellulitis, s/p surgical debridement with bone biopsy (DOS 07/09/19). His wound is larger and warm on exam. Culture obtained today to evaluate for infection and doxy prescribed out of precaution. On physical exam, he has a calcaneal gait with reduced plantarflexion strength. This is likely contributing to overloading of his plantar heel/wound area. In the interim, I would like for him use fibracol packing. I would also like him to offload using a DH2 shoe and be evaluated for a brace to accommodate his calcaneal gait.     Recommendations:  - fibracol dressing  - ambulate using walker or wheelchair when going long distances   - offload in DH2 shoe in the interim, be evaluated for brace and goal would be to fit him for this once his wound heals more  - culture obtained  - abx prescribed out of precaution  - follow up after holidays    Patient instructions on wound care were provided and understood in the End of Visit Summary. All questions were answered to patient's satisfaction today and patient to call the office if worsening of symptoms occurs or any questions arise.       HISTORY OF PRESENT ILLNESS: Michael [name] is a 84 y.o. 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 He is in high spirits this morning however reports his wound lags in healing. Patient has been caring for his wife so he reports he has been on his feet more often and has not been sleeping. Of note, patient reports he has also had weakness to his achilles and has never been able to stand on his toes.         ROS:   Consitutional: Denies fever, chills or muscle aches   Respiratory: Denies cough, shortness of breath, wheezing  Gastrointestinal: Denies abdominal pain, nausea, vomiting   Musculoskeletal: Denies joint pain, swelling, or musculoskeletal pain  Skin: +ulcer Denies rashes, sores, blisters, growths   Neurological: no numbness or tingling       RX/ ALLERGIES/ MED HX/SURG HX/ SOC HX/FAM HX: Reviewed & updated in Epic      PHYSICAL EXAM:   Consitutional: No acute distress, alert and oriented.  Psychiatric: Alert and Oriented to Person, Place and Time. Pt is Cooperative, Pleasant, and Understanding    Cardiovascular: DP and PT pulses were palpable b/l 2/4, right lower extremity edema noted . Minor telangiectasis noted b/l ankle.   ??  Dermatological: Wound worse in appearance, is larger in size and warm to the touch. Granular tissue achieved with debridement.   ??  Neurological: Gross epicritic sensation is intact but reduced. This was tested with sharp dull sensation patient is unable to distinguish   ??  Musculoskeletal: 3+/5 plantar flexion strength BLE, Calcaneal gait with reduced achilles strength causing overloading        Wound 07/05/19 Other (comment) Heel Right Right heel surgical wound (Active)   Wound Image    01/01/20 1016   Wound Status Not Healed 01/01/20 1016   Pain 3 01/01/20 1016   Dressing Status      Removed 01/01/20 1016   Wound Length (cm) 0.3 cm 01/01/20 1016   Wound Width (cm) 1.3 cm 01/01/20 1016   Wound Depth (cm) 0.6 cm 01/01/20 1016   Wound Surface Area (cm^2) 0.39  cm^2 01/01/20 1016   Wound Volume (cm^3) 0.234 cm^3 01/01/20 1016   Wound Healing % 96 01/01/20 1016   Wound Bed Pink 01/01/20 1016   Odor None 01/01/20 1016   Margins Defined edges 01/01/20 1016   Peri-wound Assessment      Callus;Edema 01/01/20 1016   Encounter Subsequent 01/01/20 1016   Slough % 1-25% 01/01/20 1016   Eschar % None 01/01/20 1016   Epithelialization % None 01/01/20 1016   Granulation % 76-100% 01/01/20 1016   Exudate Type      Sero-sanguineous 01/01/20 1016   Exudate Amnt      Moderate 01/01/20 1016   Thickness Full Thickness 01/01/20 1016   Paring No 12/09/19 1033   Texture Edema;Callus 01/01/20 1016   Moisture Normal For Patient 01/01/20 1016   Temperature WNL 01/01/20 1016   Hypergranuation No 01/01/20 1016   Tunneling      No 01/01/20 1016   Undermining     Yes 01/01/20 1016   Undermining (cm) 0.6 cm 01/01/20 1016   Starting Position (o'clock) 6 01/01/20 1016   Ending Position (o'clock) 4 01/01/20 1016   Sinus Tract No 01/01/20 1016   Treatments Cleansed/Irrigation;Pharmaceutical agent 01/01/20 1016   Picture Taken Yes 01/01/20 1016   Length (Pre Debridement) 0.3 cm 01/01/20 1049   Width (Pre Debridement) 1.3 cm 01/01/20 1049   Depth (Pre Debridement) 0.6 cm 01/01/20 1049   Pre-Procedure Pain 0 01/01/20 1049   Time Out Taken Yes 01/01/20 1049   Instrument Used scalpel 01/01/20 1049   Tissue/Material Removed non-viable;callus;skin 01/01/20 1049   Bleeding moderate 01/01/20 1049   Bleeding controlled with pressure 01/01/20 1049   Specimen Taken none 01/01/20 1049   Type of Debridement selective 01/01/20 1049   Level of Debridement skin, dermis 01/01/20 1049   Procedural Pain 0 01/01/20 1049   Length (Post-Debridement) 0.3 cm 01/01/20 1049   Width (Post Debridement) 1.3 cm 01/01/20 1049   Depth (Post-Debridement) 0.6 cm 01/01/20 1049   Post Procedural Pain 0 01/01/20 1049   Area(Pre-Debridement) 0.39 sq cm 01/01/20 1049   Area(Post-Debridement) 0.39 sq cm 01/01/20 1049   Volume(Pre-Debridement) 0.23 sq cm 01/01/20 1049   Volume(Post-Debridement) 0.23 sq cm 01/01/20 1049          PROCEDURE:  1.  Procedure: right foot, plantar heel   At this time patient's wound was debrided due to the increase in Devitalized tissue of epidermis/dermis, exudate, callus tissue, and other necrotic tissue impairing proper wound healing as reported in the Objective. All benefits, risks, and alternatives were explained in detail with the patient. A time out was taken prior to the procedure. The area was prepped in standard clinic fashion. The wound was anesthetized withlidocaine topical 4% for 15 minute prior to debridement.. An excisional Sharp, surgical debridement was performed to fully debride and remove all central devitalized and necrotic tissue including subcutaneous tissue. The debridement occurred to the level of subcutaneous tissue until  bleeding granulation tissue was observed in 100% of the wound bed.  This was achieved with the use of a curette, forceps and scalpel. Blood loss was minimal.  Hemostasis was achieved with pressure and the post-debridement measurements are documented in the chart. Patient tolerated procedure well at this time.       LABS/IMAGING:   I have personally reviewed pertinent diagnostic studies.      Scribe's Attestation: Stephens November, DPM obtained and performed the history, physical exam and medical decision making elements that were  entered into  the chart. Documentation assistance was provided by me personally, a scribe. Signed by Dolphus Jenny, Scribe, on January 01, 2020 at 9:04 AM.     ----------------------------------------------------------------------------------------------------------------------  January 06, 2020 7:35 AM. Documentation assistance provided by the Scribe. I was present during the time the encounter was recorded. The information recorded by the Scribe was done at my direction and has been reviewed and validated by me.  ----------------------------------------------------------------------------------------------------------------------

## 2020-01-01 ENCOUNTER — Encounter
Admit: 2020-01-01 | Discharge: 2020-01-02 | Payer: MEDICARE | Attending: Foot & Ankle Surgery | Primary: Foot & Ankle Surgery

## 2020-01-01 DIAGNOSIS — I110 Hypertensive heart disease with heart failure: Principal | ICD-10-CM

## 2020-01-01 DIAGNOSIS — L97411 Non-pressure chronic ulcer of right heel and midfoot limited to breakdown of skin: Principal | ICD-10-CM

## 2020-01-01 DIAGNOSIS — M79674 Pain in right toe(s): Principal | ICD-10-CM

## 2020-01-01 DIAGNOSIS — L03115 Cellulitis of right lower limb: Principal | ICD-10-CM

## 2020-01-01 DIAGNOSIS — C779 Secondary and unspecified malignant neoplasm of lymph node, unspecified: Principal | ICD-10-CM

## 2020-01-01 DIAGNOSIS — B351 Tinea unguium: Principal | ICD-10-CM

## 2020-01-01 DIAGNOSIS — L03119 Cellulitis of unspecified part of limb: Principal | ICD-10-CM

## 2020-01-01 DIAGNOSIS — L97412 Non-pressure chronic ulcer of right heel and midfoot with fat layer exposed: Principal | ICD-10-CM

## 2020-01-01 DIAGNOSIS — R2681 Unsteadiness on feet: Principal | ICD-10-CM

## 2020-01-01 DIAGNOSIS — I509 Heart failure, unspecified: Principal | ICD-10-CM

## 2020-01-01 DIAGNOSIS — M79675 Pain in left toe(s): Principal | ICD-10-CM

## 2020-01-01 DIAGNOSIS — C61 Malignant neoplasm of prostate: Principal | ICD-10-CM

## 2020-01-01 DIAGNOSIS — M869 Osteomyelitis, unspecified: Principal | ICD-10-CM

## 2020-01-01 DIAGNOSIS — E039 Hypothyroidism, unspecified: Principal | ICD-10-CM

## 2020-01-01 MED ORDER — DOXYCYCLINE HYCLATE 100 MG TABLET
ORAL_TABLET | Freq: Two times a day (BID) | ORAL | 0 refills | 10.00000 days | Status: CP
Start: 2020-01-01 — End: 2020-01-11

## 2020-01-01 NOTE — Unmapped (Addendum)
WOUND CARE INSTRUCTIONS:      Wash area with Antibacterial liquid soap with one wash cloth, clean the soap off with a clean cloth, and then pat dry with a dry cloth before the dressing change.    Dressing: Apply fibracol collagen to the wound bed, secure with gauze,kerlix, and ace wrap.     We will be submitting your dressing supply orders to [employer].  Please reorder your supplies before running out. Their # is [phone] and the order is good for 3 months.   If you have not received your order within two days after ordering, please contact the Wound Clinic at [phone]      We woiuld like for you to get an offloading shoe. You can get this from [employer] or [employer]. However, [employer] tends to be more affordable.       If you have any questions or concerns regarding your wound or wound care, please contact us at the [facility] clinic at [phone].    Stephens November, DPM    01/01/20    If your wound starts to develop the following , please call the [facility] for further advise:    ??  Increased drainage  ??  Redness around the wound  ??  Strong odor from the wound when changing the bandages  ??  Increased pain    Please do not hesitate to leave a voicemail on the nurse line. We make every effort to return your call the same day or the next day. Please leave a clear message with your name, date of birth,  and your medical record number. Leave a brief description of your problem.    If you are experiencing the following, please call us for advise or consider going to the nearest local Emergency Department or call 911.    ??  Fever of 100 F  ??  Nausea or Vomitting  ??  Pus draining from your wound  ??  Redness of the whole foot or leg  ??  Severe increase in pain above your baseline.    [facility]  [phone]        Call and make an appointment    [facility]  [address], [address], [place]  [phone]    [facility] [place]  Address: [address] [place], [place] [address]  Phone: [phone]    [facility] [place]  Address: [address] [place] [address] [place], [place] [address]  Phone: [phone]    [facility]  [address]  [place], [place] [address]  Phone: [phone]      [facility]  [address]  [place], [place] [address]  Phone: [phone]      [facility] [place]  [address] [place], [place] [address]  Phone: [phone]      [facility]    Call for an appointment    [facility] at [facility]  [address], [address]  [place], [place] [address]  Call [phone]     Prosthetics & Orthotics at [facility]  [address]    [place], [place] [address]  Call [phone]  We are located on the 1st floor,   Ezzard Standing of the [facility]    Prosthetics & Orthotics at [facility]   [facility]  [address] [place]  [place], [place] [address]  Call [phone]       OTHER LOCATIONS    - Prosthetics & Orthotics at   [facility]  [address]  [place], [place] [address]  Call [phone]   We are there on Thursdays in the PM&R department   for a clinic by appointment only    - Prosthetics & Orthotics across from [facility]  [address] [place], [place] [address]  Call [phone]  Located in the [facility] on the 2nd floor

## 2020-01-05 NOTE — Unmapped (Signed)
Michael [name] 's Xtandi shipment will be delayed as a result of a high copay.     I have reached out to the patient and was unable to leave a message. We will call the patient back to reschedule the delivery upon resolution. We have not confirmed the new delivery date.

## 2020-01-06 MED ORDER — CLINDAMYCIN HCL 300 MG CAPSULE
ORAL_CAPSULE | Freq: Three times a day (TID) | ORAL | 0 refills | 10.00000 days | Status: CP
Start: 2020-01-06 — End: 2020-01-29

## 2020-01-06 NOTE — Unmapped (Signed)
-----   Message from Karen Chafe, DPM sent at 01/06/2020  7:37 AM EST -----  Regarding: Pt call  Can we call the patient. His culture have intermediate resistance to the current antibiotic. A new abx has been called in to his pharmacy. Please encourage probiotics, and taking this medication with food

## 2020-01-06 NOTE — Unmapped (Signed)
I spoke with Michael [name] this morning to discuss with him the information noted in Dr. Ayesha Rumpf message below regarding his culture results and taking a new antibiotic. The patient verbalized understanding, and stated that he will pick up the medication today from his pharmacy. We also discussed the importance of taking with food and encouraged the intake of a probiotic. [name] verbalized understanding of this.

## 2020-01-13 NOTE — Unmapped (Signed)
[name] 's Xtandi shipment will be canceled  as a result of a high copay.     The [facility] pharmacy will reach out to the patient to schedule delivery. We will not reschedule the medication and have removed this/these medication(s) from the work request.  We have canceled this work request.

## 2020-01-14 NOTE — Unmapped (Signed)
This patient has been disenrolled from the [facility] specialty pharmacy services due to enrollment in a manufacturer assistance program that sends medicine directly to the patient. Spoke with [name] and relayed following information:  Xtandi [employer] assistance approved 01/11/2020-01/14/2021 at [facility]   - Patient will need to follow up with manufacturer for refills/concerns at [phone].    Michael [name]  [facility] Specialty Pharmacist

## 2020-01-29 ENCOUNTER — Encounter
Admit: 2020-01-29 | Discharge: 2020-01-30 | Payer: MEDICARE | Attending: Foot & Ankle Surgery | Primary: Foot & Ankle Surgery

## 2020-01-29 DIAGNOSIS — L97412 Non-pressure chronic ulcer of right heel and midfoot with fat layer exposed: Principal | ICD-10-CM

## 2020-01-29 DIAGNOSIS — E039 Hypothyroidism, unspecified: Principal | ICD-10-CM

## 2020-01-29 DIAGNOSIS — L0390 Cellulitis, unspecified: Principal | ICD-10-CM

## 2020-01-29 DIAGNOSIS — M869 Osteomyelitis, unspecified: Principal | ICD-10-CM

## 2020-01-29 DIAGNOSIS — M79675 Pain in left toe(s): Principal | ICD-10-CM

## 2020-01-29 DIAGNOSIS — R2681 Unsteadiness on feet: Principal | ICD-10-CM

## 2020-01-29 DIAGNOSIS — M79674 Pain in right toe(s): Principal | ICD-10-CM

## 2020-01-29 DIAGNOSIS — L03119 Cellulitis of unspecified part of limb: Principal | ICD-10-CM

## 2020-01-29 DIAGNOSIS — C779 Secondary and unspecified malignant neoplasm of lymph node, unspecified: Principal | ICD-10-CM

## 2020-01-29 DIAGNOSIS — I509 Heart failure, unspecified: Principal | ICD-10-CM

## 2020-01-29 DIAGNOSIS — A4902 Methicillin resistant Staphylococcus aureus infection, unspecified site: Principal | ICD-10-CM

## 2020-01-29 DIAGNOSIS — C61 Malignant neoplasm of prostate: Principal | ICD-10-CM

## 2020-01-29 DIAGNOSIS — I110 Hypertensive heart disease with heart failure: Principal | ICD-10-CM

## 2020-01-29 DIAGNOSIS — B351 Tinea unguium: Principal | ICD-10-CM

## 2020-01-29 DIAGNOSIS — L03115 Cellulitis of right lower limb: Principal | ICD-10-CM

## 2020-02-12 ENCOUNTER — Encounter: Admit: 2020-02-12 | Discharge: 2020-02-13 | Payer: MEDICARE | Attending: Internal Medicine | Primary: Internal Medicine

## 2020-02-12 DIAGNOSIS — I5022 Chronic systolic (congestive) heart failure: Principal | ICD-10-CM

## 2020-02-12 DIAGNOSIS — M48061 Spinal stenosis, lumbar region without neurogenic claudication: Principal | ICD-10-CM

## 2020-02-12 DIAGNOSIS — R634 Abnormal weight loss: Principal | ICD-10-CM

## 2020-02-12 DIAGNOSIS — E034 Atrophy of thyroid (acquired): Principal | ICD-10-CM

## 2020-02-12 DIAGNOSIS — C61 Malignant neoplasm of prostate: Principal | ICD-10-CM

## 2020-02-12 DIAGNOSIS — M86271 Subacute osteomyelitis, right ankle and foot: Principal | ICD-10-CM

## 2020-02-12 DIAGNOSIS — Z950 Presence of cardiac pacemaker: Principal | ICD-10-CM

## 2020-02-12 DIAGNOSIS — C7951 Secondary malignant neoplasm of bone: Principal | ICD-10-CM

## 2020-02-12 DIAGNOSIS — I10 Essential (primary) hypertension: Principal | ICD-10-CM

## 2020-02-12 DIAGNOSIS — N393 Stress incontinence (female) (male): Principal | ICD-10-CM

## 2020-02-12 MED ORDER — SHINGRIX (PF) 50 MCG/0.5 ML INTRAMUSCULAR SUSPENSION, KIT
Freq: Once | INTRAMUSCULAR | 1 refills | 1.00000 days | Status: CP
Start: 2020-02-12 — End: 2020-02-13
  Filled 2020-02-12: qty 0.5, 1d supply, fill #0

## 2020-02-19 ENCOUNTER — Encounter
Admit: 2020-02-19 | Discharge: 2020-02-20 | Payer: MEDICARE | Attending: Foot & Ankle Surgery | Primary: Foot & Ankle Surgery

## 2020-02-19 DIAGNOSIS — L97412 Non-pressure chronic ulcer of right heel and midfoot with fat layer exposed: Principal | ICD-10-CM

## 2020-02-19 DIAGNOSIS — M869 Osteomyelitis, unspecified: Principal | ICD-10-CM

## 2020-02-19 DIAGNOSIS — C779 Secondary and unspecified malignant neoplasm of lymph node, unspecified: Principal | ICD-10-CM

## 2020-02-19 DIAGNOSIS — I110 Hypertensive heart disease with heart failure: Principal | ICD-10-CM

## 2020-02-19 DIAGNOSIS — I509 Heart failure, unspecified: Principal | ICD-10-CM

## 2020-02-19 DIAGNOSIS — L03115 Cellulitis of right lower limb: Principal | ICD-10-CM

## 2020-02-19 DIAGNOSIS — C801 Malignant (primary) neoplasm, unspecified: Principal | ICD-10-CM

## 2020-02-26 ENCOUNTER — Ambulatory Visit: Admit: 2020-02-26 | Discharge: 2020-02-27 | Payer: MEDICARE

## 2020-02-26 DIAGNOSIS — L97412 Non-pressure chronic ulcer of right heel and midfoot with fat layer exposed: Principal | ICD-10-CM

## 2020-03-04 ENCOUNTER — Ambulatory Visit
Admit: 2020-03-04 | Discharge: 2020-03-05 | Payer: MEDICARE | Attending: Foot & Ankle Surgery | Primary: Foot & Ankle Surgery

## 2020-03-04 DIAGNOSIS — M79675 Pain in left toe(s): Principal | ICD-10-CM

## 2020-03-04 DIAGNOSIS — B351 Tinea unguium: Principal | ICD-10-CM

## 2020-03-04 DIAGNOSIS — L97412 Non-pressure chronic ulcer of right heel and midfoot with fat layer exposed: Principal | ICD-10-CM

## 2020-03-04 DIAGNOSIS — R2681 Unsteadiness on feet: Principal | ICD-10-CM

## 2020-03-04 DIAGNOSIS — M79674 Pain in right toe(s): Principal | ICD-10-CM

## 2020-03-04 DIAGNOSIS — L03119 Cellulitis of unspecified part of limb: Principal | ICD-10-CM

## 2020-03-16 ENCOUNTER — Encounter: Admit: 2020-03-16 | Discharge: 2020-03-17 | Payer: MEDICARE

## 2020-03-22 ENCOUNTER — Encounter: Admit: 2020-03-22 | Discharge: 2020-04-04 | Payer: MEDICARE

## 2020-03-22 ENCOUNTER — Encounter: Admit: 2020-03-22 | Discharge: 2020-04-20 | Payer: MEDICARE

## 2020-03-24 ENCOUNTER — Encounter
Admit: 2020-03-24 | Discharge: 2020-03-25 | Payer: MEDICARE | Attending: Hematology & Oncology | Primary: Hematology & Oncology

## 2020-03-24 ENCOUNTER — Encounter: Admit: 2020-03-24 | Discharge: 2020-03-25 | Payer: MEDICARE

## 2020-03-24 DIAGNOSIS — C7951 Secondary malignant neoplasm of bone: Principal | ICD-10-CM

## 2020-03-24 DIAGNOSIS — C61 Malignant neoplasm of prostate: Principal | ICD-10-CM

## 2020-03-25 ENCOUNTER — Ambulatory Visit: Admit: 2020-03-25 | Payer: MEDICARE | Attending: Foot & Ankle Surgery | Primary: Foot & Ankle Surgery

## 2020-04-29 ENCOUNTER — Encounter: Admit: 2020-04-29 | Discharge: 2020-04-30 | Payer: MEDICARE

## 2020-04-29 ENCOUNTER — Ambulatory Visit
Admit: 2020-04-29 | Discharge: 2020-04-30 | Payer: MEDICARE | Attending: Foot & Ankle Surgery | Primary: Foot & Ankle Surgery

## 2020-04-29 DIAGNOSIS — L97412 Non-pressure chronic ulcer of right heel and midfoot with fat layer exposed: Principal | ICD-10-CM

## 2020-04-29 DIAGNOSIS — M79675 Pain in left toe(s): Principal | ICD-10-CM

## 2020-04-29 DIAGNOSIS — M79674 Pain in right toe(s): Principal | ICD-10-CM

## 2020-04-29 DIAGNOSIS — R2681 Unsteadiness on feet: Principal | ICD-10-CM

## 2020-04-29 DIAGNOSIS — B351 Tinea unguium: Principal | ICD-10-CM

## 2020-05-15 DEATH — deceased

## 2020-05-20 ENCOUNTER — Encounter: Admit: 2020-05-20 | Discharge: 2020-05-21 | Payer: MEDICARE | Attending: Internal Medicine | Primary: Internal Medicine

## 2020-05-20 DIAGNOSIS — E034 Atrophy of thyroid (acquired): Principal | ICD-10-CM

## 2020-05-20 DIAGNOSIS — R7303 Prediabetes: Principal | ICD-10-CM

## 2020-05-20 DIAGNOSIS — C61 Malignant neoplasm of prostate: Principal | ICD-10-CM

## 2020-05-20 DIAGNOSIS — I10 Essential (primary) hypertension: Principal | ICD-10-CM

## 2020-05-20 DIAGNOSIS — Z23 Encounter for immunization: Principal | ICD-10-CM

## 2020-05-20 DIAGNOSIS — M48061 Spinal stenosis, lumbar region without neurogenic claudication: Principal | ICD-10-CM

## 2020-05-20 DIAGNOSIS — M86271 Subacute osteomyelitis, right ankle and foot: Principal | ICD-10-CM

## 2020-05-20 DIAGNOSIS — I5022 Chronic systolic (congestive) heart failure: Principal | ICD-10-CM

## 2020-05-20 DIAGNOSIS — C7951 Secondary malignant neoplasm of bone: Principal | ICD-10-CM

## 2020-05-20 MED ORDER — SHINGRIX (PF) 50 MCG/0.5 ML INTRAMUSCULAR SUSPENSION, KIT
Freq: Once | INTRAMUSCULAR | 0 refills | 1 days | Status: CP
Start: 2020-05-20 — End: 2020-05-20

## 2020-05-27 ENCOUNTER — Encounter
Admit: 2020-05-27 | Discharge: 2020-05-28 | Payer: MEDICARE | Attending: Foot & Ankle Surgery | Primary: Foot & Ankle Surgery

## 2020-05-27 DIAGNOSIS — L97412 Non-pressure chronic ulcer of right heel and midfoot with fat layer exposed: Principal | ICD-10-CM

## 2020-05-27 DIAGNOSIS — R2681 Unsteadiness on feet: Principal | ICD-10-CM

## 2020-05-27 DIAGNOSIS — M79674 Pain in right toe(s): Principal | ICD-10-CM

## 2020-05-27 DIAGNOSIS — L03119 Cellulitis of unspecified part of limb: Principal | ICD-10-CM

## 2020-05-27 DIAGNOSIS — M79675 Pain in left toe(s): Principal | ICD-10-CM

## 2020-05-27 DIAGNOSIS — B351 Tinea unguium: Principal | ICD-10-CM

## 2020-05-27 MED ORDER — DAKIN'S SOLUTION 0.25 %
2 refills | 0 days | Status: CP
Start: 2020-05-27 — End: ?

## 2020-06-15 ENCOUNTER — Ambulatory Visit
Admit: 2020-06-15 | Discharge: 2020-06-16 | Payer: MEDICARE | Attending: Foot & Ankle Surgery | Primary: Foot & Ankle Surgery

## 2020-06-15 DIAGNOSIS — M25371 Other instability, right ankle: Principal | ICD-10-CM

## 2020-06-15 DIAGNOSIS — L97412 Non-pressure chronic ulcer of right heel and midfoot with fat layer exposed: Principal | ICD-10-CM

## 2020-06-28 ENCOUNTER — Other Ambulatory Visit: Admit: 2020-06-28 | Discharge: 2020-06-28 | Payer: MEDICARE

## 2020-06-28 ENCOUNTER — Encounter: Admit: 2020-06-28 | Discharge: 2020-06-28 | Payer: MEDICARE

## 2020-06-28 ENCOUNTER — Encounter: Admit: 2020-06-28 | Discharge: 2020-06-28 | Payer: MEDICARE | Attending: Internal Medicine | Primary: Internal Medicine

## 2020-06-28 ENCOUNTER — Encounter
Admit: 2020-06-28 | Discharge: 2020-06-28 | Payer: MEDICARE | Attending: Hematology & Oncology | Primary: Hematology & Oncology

## 2020-06-28 DIAGNOSIS — C61 Malignant neoplasm of prostate: Principal | ICD-10-CM

## 2020-06-28 DIAGNOSIS — C775 Secondary and unspecified malignant neoplasm of intrapelvic lymph nodes: Principal | ICD-10-CM

## 2020-06-28 DIAGNOSIS — I872 Venous insufficiency (chronic) (peripheral): Principal | ICD-10-CM

## 2020-06-28 DIAGNOSIS — L97919 Non-pressure chronic ulcer of unspecified part of right lower leg with unspecified severity: Principal | ICD-10-CM

## 2020-06-28 DIAGNOSIS — I10 Essential (primary) hypertension: Principal | ICD-10-CM

## 2020-06-28 DIAGNOSIS — C7951 Secondary malignant neoplasm of bone: Principal | ICD-10-CM

## 2020-06-28 DIAGNOSIS — M86271 Subacute osteomyelitis, right ankle and foot: Principal | ICD-10-CM

## 2020-06-28 DIAGNOSIS — I5022 Chronic systolic (congestive) heart failure: Principal | ICD-10-CM

## 2020-06-28 DIAGNOSIS — I83019 Varicose veins of right lower extremity with ulcer of unspecified site: Principal | ICD-10-CM

## 2020-06-29 MED ORDER — FUROSEMIDE 20 MG TABLET
ORAL_TABLET | Freq: Every day | ORAL | 2 refills | 30.00000 days | Status: CP | PRN
Start: 2020-06-29 — End: ?

## 2020-07-06 ENCOUNTER — Encounter: Admit: 2020-07-06 | Discharge: 2020-07-07 | Payer: MEDICARE | Attending: Gerontology | Primary: Gerontology

## 2020-07-06 DIAGNOSIS — I872 Venous insufficiency (chronic) (peripheral): Principal | ICD-10-CM

## 2020-07-06 DIAGNOSIS — Z86718 Personal history of other venous thrombosis and embolism: Principal | ICD-10-CM

## 2020-07-06 DIAGNOSIS — I5020 Unspecified systolic (congestive) heart failure: Principal | ICD-10-CM

## 2020-07-06 DIAGNOSIS — R600 Localized edema: Principal | ICD-10-CM

## 2020-07-06 DIAGNOSIS — I83019 Varicose veins of right lower extremity with ulcer of unspecified site: Principal | ICD-10-CM

## 2020-07-06 DIAGNOSIS — L97919 Non-pressure chronic ulcer of unspecified part of right lower leg with unspecified severity: Principal | ICD-10-CM

## 2020-07-06 DIAGNOSIS — L03116 Cellulitis of left lower limb: Principal | ICD-10-CM

## 2020-07-06 MED ORDER — CEPHALEXIN 500 MG CAPSULE
ORAL_CAPSULE | Freq: Four times a day (QID) | ORAL | 0 refills | 7.00000 days | Status: CP
Start: 2020-07-06 — End: 2020-07-13

## 2020-07-08 ENCOUNTER — Ambulatory Visit: Admit: 2020-07-08 | Payer: MEDICARE | Attending: Foot & Ankle Surgery | Primary: Foot & Ankle Surgery

## 2020-07-15 DEATH — deceased
